# Patient Record
Sex: Female | Born: 1985 | Race: Black or African American | Hispanic: No | Marital: Married | Smoking: Never smoker
Health system: Southern US, Community
[De-identification: ages and names within clinical notes are randomized; demographics above are authoritative.]

## PROBLEM LIST (undated history)

## (undated) DIAGNOSIS — Z87442 Personal history of urinary calculi: Secondary | ICD-10-CM

## (undated) DIAGNOSIS — Z9889 Other specified postprocedural states: Secondary | ICD-10-CM

## (undated) DIAGNOSIS — R112 Nausea with vomiting, unspecified: Secondary | ICD-10-CM

## (undated) DIAGNOSIS — K219 Gastro-esophageal reflux disease without esophagitis: Secondary | ICD-10-CM

## (undated) DIAGNOSIS — Z8489 Family history of other specified conditions: Secondary | ICD-10-CM

## (undated) HISTORY — DX: Gastro-esophageal reflux disease without esophagitis: K21.9

---

## 2003-09-14 ENCOUNTER — Emergency Department (HOSPITAL_COMMUNITY): Admission: EM | Admit: 2003-09-14 | Discharge: 2003-09-14 | Payer: Self-pay | Admitting: Emergency Medicine

## 2005-11-09 ENCOUNTER — Encounter (INDEPENDENT_AMBULATORY_CARE_PROVIDER_SITE_OTHER): Payer: Self-pay | Admitting: Specialist

## 2005-11-09 ENCOUNTER — Ambulatory Visit (HOSPITAL_COMMUNITY): Admission: RE | Admit: 2005-11-09 | Discharge: 2005-11-09 | Payer: Self-pay | Admitting: Obstetrics and Gynecology

## 2007-11-21 ENCOUNTER — Ambulatory Visit (HOSPITAL_COMMUNITY): Admission: RE | Admit: 2007-11-21 | Discharge: 2007-11-21 | Payer: Self-pay | Admitting: Obstetrics and Gynecology

## 2007-11-29 ENCOUNTER — Emergency Department (HOSPITAL_COMMUNITY): Admission: EM | Admit: 2007-11-29 | Discharge: 2007-11-29 | Payer: Self-pay | Admitting: Emergency Medicine

## 2007-11-30 ENCOUNTER — Ambulatory Visit (HOSPITAL_COMMUNITY): Admission: RE | Admit: 2007-11-30 | Discharge: 2007-11-30 | Payer: Self-pay | Admitting: Obstetrics and Gynecology

## 2007-11-30 ENCOUNTER — Encounter: Payer: Self-pay | Admitting: Obstetrics and Gynecology

## 2008-08-05 ENCOUNTER — Emergency Department (HOSPITAL_COMMUNITY): Admission: EM | Admit: 2008-08-05 | Discharge: 2008-08-05 | Payer: Self-pay | Admitting: Emergency Medicine

## 2010-10-19 LAB — COMPREHENSIVE METABOLIC PANEL
ALT: 17 U/L (ref 0–35)
AST: 18 U/L (ref 0–37)
CO2: 29 mEq/L (ref 19–32)
Calcium: 9.8 mg/dL (ref 8.4–10.5)
GFR calc Af Amer: >60 mL/min (ref >60)
GFR calc non Af Amer: >60 mL/min (ref >60)
Sodium: 135 mEq/L (ref 135–145)
Total Protein: 7.8 g/dL (ref 6.0–8.3)

## 2010-10-19 LAB — URINALYSIS, ROUTINE W REFLEX MICROSCOPIC
Bilirubin Urine: NEGATIVE
Nitrite: NEGATIVE
Specific Gravity, Urine: 1.015 (ref 1.005–1.030)
Urobilinogen, UA: 1 mg/dL (ref 0.0–1.0)

## 2010-10-19 LAB — DIFFERENTIAL
Eosinophils Absolute: 0.1 10*3/uL (ref 0.0–0.7)
Eosinophils Relative: 1 % (ref 0–5)
Lymphs Abs: 3.3 10*3/uL (ref 0.7–4.0)
Monocytes Relative: 6 % (ref 3–12)

## 2010-10-19 LAB — PREGNANCY, URINE: Preg Test, Ur: NEGATIVE

## 2010-10-19 LAB — WET PREP, GENITAL
Trich, Wet Prep: NONE SEEN
Yeast Wet Prep HPF POC: NONE SEEN

## 2010-10-19 LAB — CBC
MCHC: 34.1 g/dL (ref 30.0–36.0)
RBC: 4.6 MIL/uL (ref 3.87–5.11)

## 2010-10-22 ENCOUNTER — Inpatient Hospital Stay (HOSPITAL_COMMUNITY)
Admission: AD | Admit: 2010-10-22 | Discharge: 2010-10-22 | Disposition: A | Discharge status: Home / Self Care | Payer: Self-pay | Source: Ambulatory Visit | Attending: Obstetrics and Gynecology | Admitting: Obstetrics and Gynecology

## 2010-10-22 ENCOUNTER — Encounter (HOSPITAL_COMMUNITY): Payer: Self-pay | Admitting: Radiology

## 2010-10-22 ENCOUNTER — Inpatient Hospital Stay (HOSPITAL_COMMUNITY): Payer: Self-pay

## 2010-10-22 DIAGNOSIS — R52 Pain, unspecified: Secondary | ICD-10-CM

## 2010-10-22 DIAGNOSIS — R58 Hemorrhage, not elsewhere classified: Secondary | ICD-10-CM

## 2010-10-22 DIAGNOSIS — O200 Threatened abortion: Secondary | ICD-10-CM | POA: Insufficient documentation

## 2010-10-22 LAB — URINALYSIS, ROUTINE W REFLEX MICROSCOPIC
Bilirubin Urine: NEGATIVE
Ketones, ur: 15 mg/dL — AB
Nitrite: NEGATIVE
Urobilinogen, UA: 1 mg/dL (ref 0.0–1.0)

## 2010-10-22 LAB — CBC
HCT: 40.5 % (ref 36.0–46.0)
MCH: 28.6 pg (ref 26.0–34.0)
MCV: 86.5 fL (ref 78.0–100.0)
Platelets: 366 10*3/uL (ref 150–400)
RBC: 4.68 MIL/uL (ref 3.87–5.11)

## 2010-10-22 LAB — DIFFERENTIAL
Eosinophils Absolute: 0.1 10*3/uL (ref 0.0–0.7)
Eosinophils Relative: 1 % (ref 0–5)
Lymphs Abs: 2.1 10*3/uL (ref 0.7–4.0)
Monocytes Relative: 6 % (ref 3–12)

## 2010-10-22 LAB — WET PREP, GENITAL
Trich, Wet Prep: NONE SEEN
WBC, Wet Prep HPF POC: NONE SEEN

## 2010-10-22 LAB — POCT PREGNANCY, URINE: Preg Test, Ur: POSITIVE

## 2010-10-23 LAB — GC/CHLAMYDIA PROBE AMP, GENITAL
Chlamydia, DNA Probe: NEGATIVE
GC Probe Amp, Genital: NEGATIVE

## 2010-11-08 ENCOUNTER — Other Ambulatory Visit: Payer: Self-pay | Admitting: Obstetrics and Gynecology

## 2010-11-08 ENCOUNTER — Encounter (INDEPENDENT_AMBULATORY_CARE_PROVIDER_SITE_OTHER): Payer: Self-pay | Admitting: Obstetrics and Gynecology

## 2010-11-08 DIAGNOSIS — O039 Complete or unspecified spontaneous abortion without complication: Secondary | ICD-10-CM

## 2010-11-08 DIAGNOSIS — Z01419 Encounter for gynecological examination (general) (routine) without abnormal findings: Secondary | ICD-10-CM

## 2010-11-09 NOTE — Group Therapy Note (Signed)
Darlene Vazquez, BOUR NO.:  [id number]  MEDICAL RECORD NO.:  [id number]           PATIENT TYPE:  A  LOCATION:  [facility]                   FACILITY:  [facility]  PHYSICIAN:  Argentina Donovan, MD        DATE OF BIRTH:  06-22-1986  DATE OF SERVICE:  11/08/2010                                 CLINIC NOTE  CHIEF COMPLAINT:  Followup for ED visit for vaginal bleeding and SAB.  HISTORY OF PRESENT ILLNESS:  The patient is a 25 year old African American now G3, P 0-0-3-0, here for ED followup on October 22, 2010.  The patient reports that she had a known pregnancy, developed vaginal bleeding on October 21, 2010, and went to the ED on October 22, 2010, for evaluation.  She reports knowing that she was less than [redacted] weeks pregnant at that time.  At the ED, she was noted to have a positive urine pregnancy test and then a serum hCG of 4883.  Otherwise, her CBC was within normal limits, and she has O positive blood type.  She also had transvaginal ultrasound at that time, shows no gestational sac, and found it suspicious for a SAB given the history of bleeding.  She also has noted to have two small fibroids seen.  Therefore, she was instructed to follow up in this clinic today.  She reports that since being seen on October 22, 2010, she has been doing better.  She has continued to have vaginal bleeding until last 3-4 days, not heavy, not passing a severe amount of clots.  Otherwise, she is doing well.  No other symptoms.  She did take the Cytotec that was prescribed in the ED. She is interested in becoming pregnant in the future and is on a contraception now.  She is due for Pap smear.  PAST MEDICAL HISTORY:  She has hypertension, she is on medications, atenolol and chlorthalidone combination.  SURGICAL HISTORY:  None other than a history of tubal pregnancy in which she had her left fallopian tube removed.  FAMILY HISTORY:  Hypertension and diabetes.  No history of breast cancer, colon  cancer, or ovarian cancer.  GYNECOLOGICAL HISTORY:  No history of abnormal Pap smear or history of STDs.  ALLERGIES:  None.  PHYSICAL EXAMINATION:  VITAL SIGNS:  Temperature 97.1, pulse 94, blood pressure is 146/99, weight 161 pounds, height 5 feet 1 inch. GENERAL:  Awake, alert, and oriented x3.  A pleasant-appearing female. HEAD:  Atraumatic and normocephalic. HEART:  Regular rate and rhythm.  No murmurs, rubs, or gallops. LUNGS:  Clear to auscultation bilaterally.  No wheezes, rhonchi, or rales. ABDOMEN:  Soft, nontender, nondistended.  Bowel sounds x4. VAGINA:  No external lesions.  No blood.  No discharge noted in vagina. Cervix appeared normal.  Pap smear was obtained.  Bimanual exam, no adnexal tenderness, no uterine abnormalities.  No tenderness.  ASSESSMENT AND PLAN: 1. History of spontaneous abortion.  We will obtain quantitative hCG     today to make sure it is trending downward.  Also check a CBC.  Pap     smear obtained today, also gave preconception and counseling.  Instructed the patient to start on prenatal vitamins. 2. Hypertension.  Her blood pressure was only mildly elevated today,     146/99, recheck was 131/80.  The patient does report that she does     feel better and she has been on a blood pressure medicine for a     long time.  So, I gave her a refill of hydrochlorothiazide 25 mg     p.o. 90-day supply, she can take daily.  She will need to follow up     with her PCP for further refills.  I did discontinue the atenolol     due to it's category B risk.  The patient voiced understanding and     will follow up as needed.          ______________________________ Argentina Donovan, MD    PR/MEDQ  D:  11/08/2010  T:  11/09/2010  Job:  [id number]

## 2010-11-16 NOTE — Op Note (Signed)
NAMEANNAKA, CLEAVER                ACCOUNT NO.:  [id number]   MEDICAL RECORD NO.:  [id number]          PATIENT TYPE:  AMB   LOCATION:  DAY                           FACILITY:  [facility]   PHYSICIAN:  Tilda Burrow, M.D. DATE OF BIRTH:  Feb 19, 1986   DATE OF PROCEDURE:  11/30/2007  DATE OF DISCHARGE:                               OPERATIVE REPORT   PREOPERATIVE DIAGNOSIS:  Missed abortion.   POSTOPERATIVE DIAGNOSIS:  Missed abortion.   PROCEDURES:  Suction, dilation, and curettage.   SURGEON:  Tilda Burrow, MD   ASSISTANT:  None.   ANESTHESIA:  General with LMA.   COMPLICATIONS:  None.   FINDINGS:  Uterus sounding in the anteflexed position to 11 cm  preprocedure.  Products of conception consistent with missed AB removed.  Blood type RH positive.   DETAILS OF PROCEDURE:  The patient was taken to the operating room,  prepped and draped for vaginal procedure.  A single-tooth tenaculum was  used to grasp the prepped cervix, on the anterior lip, and then the  uterus sounded to 11 cm in the anteflexed position.  The cervical  dilation with Hegar dilators was used to dilated the cervix to 29-French  allowing introduction of a curved 9-mm suction curette, which was then  placed on suction.  Circumferential curettage performed obtaining blood,  fluid, and tissue consistent with missed AB.  Smooth sharp curettage in  all quadrants confirmed the uterus was satisfactorily evacuated.  There  was sensational heart shape contour to the intrauterine cavity, not  particularly of extreme, and both cornual areas seemed satisfactorily  evacuated at the end of the procedure.  The patient tolerated the  procedure well and went to recovery room in good condition.   ESTIMATED BLOOD LOSS:  300 mL.      Tilda Burrow, M.D.  Electronically Signed     JVF/MEDQ  D:  11/30/2007  T:  12/01/2007  Job:  [id number]

## 2010-11-16 NOTE — H&P (Signed)
Darlene Vazquez, Darlene Vazquez                ACCOUNT NO.:  [id number]   MEDICAL RECORD NO.:  [id number]          PATIENT TYPE:  AMB   LOCATION:  DAY                           FACILITY:  [facility]   PHYSICIAN:  Tilda Burrow, M.D. DATE OF BIRTH:  [birth year]   DATE OF ADMISSION:  DATE OF DISCHARGE:  LH                              HISTORY & PHYSICAL   ADMISSION DIAGNOSIS:  Missed abortion at [redacted] weeks gestation.   HISTORY OF PRESENT ILLNESS:  This 25 year old female, gravida 2, para 0  AB 1, LMP March ?  date, ultrasound [facility] July 06, 2008, based on 6-week  ultrasound, presents back for a follow-up visit and has absence of fetal  heart motion on visit Nov 16, 2007.  The fetal pole was 5 mm.  It has  been followed for a week, and there has been no increase in size, and  there is no fetal heart motion present.  The patient accepts the  inevitability of pregnancy loss at this point.  The patient understands  the nonviability of the pregnancy and finally accepts it.  She desires  to proceed with suction D&C.  This procedure has been reviewed with the  patient, and we will proceed tomorrow on Nov 22, 2007.   PAST MEDICAL HISTORY:  Benign.   SURGICAL HISTORY:  Left ectopic surgery, 2007, treated by left linear  salpingostomy converted to salpingectomy due to bleeding.   PHYSICAL EXAMINATION:  GENERAL:  Shows an anxious female, alert,  oriented x3.  HEENT:  Pupils equal, round, and reactive.  NECK:  Supple.  CARDIOVASCULAR:  Unremarkable.  ABDOMEN:  Nontender.  PELVIC:  External genitalia normal.  Uterus 6 weeks' size.   LABORATORY DATA:  Includes a low progesterone level of 7.6 when checked  on Nov 14, 2007, quantitative HCG of 62,000.  Hemoglobin 13.6,  hematocrit 41.  Urine drug screen negative.  Maternal blood type is  ___________.   IMPRESSION:  Pregnancy 8 weeks, missed abortion.   PLAN:  Suction dilation and curettage scheduled for Nov 22, 2007.  Will  perform suction D&C and confirm  blood type prior to discharge tomorrow.   Note:  Patient has declined to proceed with D& C , and as of 11/28/07,  patient has not returned to [facility] for followup, though contacted  by phone      Tilda Burrow, M.D.  Electronically Signed     JVF/MEDQ  D:  11/21/2007  T:  11/22/2007  Job:  [id number]

## 2010-11-19 NOTE — H&P (Signed)
Darlene Vazquez, Darlene Vazquez             ACCOUNT NO.:  [id number]   MEDICAL RECORD NO.:  [id number]          PATIENT TYPE:  AMB   LOCATION:  DAY                           FACILITY:  [facility]   PHYSICIAN:  Tilda Burrow, M.D. DATE OF BIRTH:  August 02, 1985   DATE OF ADMISSION:  11/09/2005  DATE OF DISCHARGE:  LH                                HISTORY & PHYSICAL   PREOPERATIVE DIAGNOSIS:  Left ectopic pregnancy.   ADMISSION DIAGNOSIS:  Left ectopic pregnancy.   HISTORY OF PRESENT ILLNESS:  This 25 year old female diagnosed with left  ectopic pregnancy on Tuesday of this week based on quantitative HCG of  30,000, no bleeding and actually no pain but ultrasound identification of  left adnexal gestational sac.  Patient was counseled of her treatment  options and after discussion of consideration of methotrexate versus  surgical intervention, agrees to the recommended laparoscopy.  Plans are for  linear salpingostomy and attempted salvage of tube.   ALLERGIES:  NO KNOWN DRUG ALLERGIES.   MEDICATION:  Prenatal vitamins.   PAST MEDICAL HISTORY:  Benign.   PAST SURGICAL HISTORY:  Negative.   PHYSICAL EXAMINATION:  VITAL SIGNS:  Height 5 feet 2 inches, weight 163,  blood pressure 131/82, pulse 100, respirations 20.  HEENT:  Pupils are equal, round and reactive to light.  Extraocular  movements intact.  CHEST:  Clear.  CARDIOVASCULAR:  Regular rate and rhythm.  ABDOMEN:  Nontender without scars.   Ultrasound confirming anteflexed uterus, tiny endometrial stripe.  Large  left adnexal sac greater than 2 cm without any yolk sac or fetal pole  identifiable.  Ovaries were distinguishable on ultrasound.   ASSESSMENT:  Left ectopic.   PLAN:  Left linear salpingostomy, possible salpingectomy.      Tilda Burrow, M.D.  Electronically Signed     JVF/MEDQ  D:  11/09/2005  T:  11/09/2005  Job:  [id number]

## 2010-11-19 NOTE — Op Note (Signed)
Darlene Vazquez, LEVANDOWSKI             ACCOUNT NO.:  [id number]   MEDICAL RECORD NO.:  [id number]          PATIENT TYPE:  AMB   LOCATION:  DAY                           FACILITY:  [facility]   PHYSICIAN:  Tilda Burrow, M.D. DATE OF BIRTH:  1985-10-18   DATE OF PROCEDURE:  11/09/2005  DATE OF DISCHARGE:                                 OPERATIVE REPORT   PREOPERATIVE DIAGNOSIS:  Left ectopic pregnancy, unruptured.   POSTOPERATIVE DIAGNOSIS:  Left ectopic pregnancy, unruptured.   PROCEDURE:  Left linear salpingostomy converted to left salpingectomy.   SURGEON:  Tilda Burrow, M.D.   ASSISTANTAmie Critchley, CST.   ANESTHESIA:  General.   COMPLICATIONS:  Persistent bleeding from ectopic bed not responding to 18 mL  of Pitressin solution and 8 mL of Marcaine with epinephrine.   DETAILS OF PROCEDURE:  The patient was taken to the operating room, prepped  and draped for lower abdominal surgeries with legs in low lithotomy leg  support, Foley catheter in place.  The uterus was _grasped_ with Hulka  tenaculum inside the cervix.  The infraumbilical vertical 1 cm skin incision  was made as well as a transverse suprapubic incision and a similar one in  the right lower quadrant.  Veress needle was introduced with ease through  the umbilicus, being careful to orient towards the pelvis, pneumoperitoneum  performed after confirmation of its peritoneal location using water droplet  testing.  The linear salpingostomy was performed after infiltration around  the ectopic with Pitressin solution 1 mL in 25 mL solution.  The ectopic  tissue could be easily identified and then adequate dissection was used to  mobilize the specimen to where it could be extracted out of the tubal  opening.  The specimen was removed easily and further irrigation confirmed a  rather complete evacuation.  Unfortunately, the patient bled rather  vigorously from this.  Percutaneous injection into the infundibulopelvic  ligament was then performed again, using Pitressin solution and then once  again the tube was grasped with a laparoscopic Babcock placed under some  compressive effect and held for another 5 minutes.  Once again the placental  bed bled vigorously.  The efforts to salvage _the fallopian tube__ were  continued one further time when Marcaine solution 8 mL was injected around  the ectopic bed and in the infundibulopelvic ligament.  Again, several  minutes of waiting was performed under pressure to try to control to achieve  hemostasis.  Unfortunately, the bleeding persisted and was sufficiently  vigorous.  It was felt that we were going to not be very successful without  using significant cautery.  We used some cautery on the surface edges.  It  would not interfere with intratubal lumen, but the bed itself could not be  cauterized without causing sufficient damage, it was felt futile to try to  salvage the tube further.  We, therefore, used unipolar cautery to transect  across the tubal base where it enters the uterus, and then placed an  Endoloop around the mesosalpinx.  Specimen was then cut off, the stump  confirmed as hemostatic.  Irrigation  of the pelvis again performed,  hemostasis confirmed.  Saline, 200 mL or more, was left in the abdomen, the  laparoscopic equipment  removed after decompression of the carbon dioxide, and then subcuticular #3-  0 Dexon closure of the skin incisions performed.  The patient tolerated the  procedure well with blood type confirmed as O positive in the recovery room.  The patient will be discharged this p.m. with routine postsurgical  instructions.      Tilda Burrow, M.D.  Electronically Signed     JVF/MEDQ  D:  11/09/2005  T:  11/10/2005  Job:  [id number]

## 2011-03-30 LAB — CBC
HCT: 39
Hemoglobin: 13.7
MCHC: 35
MCV: 85.4
RBC: 4.57

## 2011-03-30 LAB — DIFFERENTIAL
Basophils Relative: 2 — ABNORMAL HIGH
Eosinophils Absolute: 0.2
Monocytes Absolute: 0.6
Neutro Abs: 3.8

## 2011-03-30 LAB — URINALYSIS, ROUTINE W REFLEX MICROSCOPIC
Bilirubin Urine: NEGATIVE
Ketones, ur: NEGATIVE
Nitrite: NEGATIVE
Protein, ur: NEGATIVE
Specific Gravity, Urine: 1.01
Urobilinogen, UA: 0.2

## 2011-12-21 ENCOUNTER — Other Ambulatory Visit: Payer: Self-pay | Admitting: Lab

## 2013-11-09 ENCOUNTER — Emergency Department (HOSPITAL_COMMUNITY): Payer: Managed Care, Other (non HMO) | Admitting: Anesthesiology

## 2013-11-09 ENCOUNTER — Emergency Department (HOSPITAL_COMMUNITY): Payer: Managed Care, Other (non HMO)

## 2013-11-09 ENCOUNTER — Encounter (HOSPITAL_COMMUNITY): Payer: Managed Care, Other (non HMO) | Admitting: Anesthesiology

## 2013-11-09 ENCOUNTER — Encounter (HOSPITAL_COMMUNITY)
Admission: EM | Disposition: A | Discharge status: Home / Self Care | Payer: Self-pay | Source: Home / Self Care | Attending: Obstetrics & Gynecology

## 2013-11-09 ENCOUNTER — Inpatient Hospital Stay (HOSPITAL_COMMUNITY)
Admission: EM | Admit: 2013-11-09 | Discharge: 2013-11-11 | DRG: 777 | Disposition: A | Discharge status: Home / Self Care | Payer: Managed Care, Other (non HMO) | Attending: Obstetrics & Gynecology | Admitting: Obstetrics & Gynecology

## 2013-11-09 ENCOUNTER — Encounter (HOSPITAL_COMMUNITY): Payer: Self-pay | Admitting: Emergency Medicine

## 2013-11-09 DIAGNOSIS — R1031 Right lower quadrant pain: Secondary | ICD-10-CM

## 2013-11-09 DIAGNOSIS — O009 Unspecified ectopic pregnancy without intrauterine pregnancy: Secondary | ICD-10-CM | POA: Diagnosis present

## 2013-11-09 DIAGNOSIS — O00109 Unspecified tubal pregnancy without intrauterine pregnancy: Principal | ICD-10-CM | POA: Diagnosis present

## 2013-11-09 DIAGNOSIS — R55 Syncope and collapse: Secondary | ICD-10-CM

## 2013-11-09 DIAGNOSIS — D649 Anemia, unspecified: Secondary | ICD-10-CM | POA: Diagnosis present

## 2013-11-09 DIAGNOSIS — I959 Hypotension, unspecified: Secondary | ICD-10-CM | POA: Diagnosis present

## 2013-11-09 HISTORY — PX: DIAGNOSTIC LAPAROSCOPY WITH REMOVAL OF ECTOPIC PREGNANCY: SHX6449

## 2013-11-09 HISTORY — PX: LAPAROSCOPY: SHX197

## 2013-11-09 HISTORY — PX: UNILATERAL SALPINGECTOMY: SHX6160

## 2013-11-09 LAB — CBC WITH DIFFERENTIAL/PLATELET
Basophils Absolute: 0 10*3/uL (ref 0.0–0.1)
Basophils Relative: 0 % (ref 0–1)
Eosinophils Absolute: 0 10*3/uL (ref 0.0–0.7)
Eosinophils Relative: 0 % (ref 0–5)
HCT: 25.6 % — ABNORMAL LOW (ref 36.0–46.0)
Hemoglobin: 8.6 g/dL — ABNORMAL LOW (ref 12.0–15.0)
Lymphocytes Relative: 15 % (ref 12–46)
Lymphs Abs: 3 10*3/uL (ref 0.7–4.0)
MCH: 29.1 pg (ref 26.0–34.0)
MCHC: 33.6 g/dL (ref 30.0–36.0)
MCV: 86.5 fL (ref 78.0–100.0)
Monocytes Absolute: 1 10*3/uL (ref 0.1–1.0)
Monocytes Relative: 5 % (ref 3–12)
Neutro Abs: 15.8 10*3/uL — ABNORMAL HIGH (ref 1.7–7.7)
Neutrophils Relative %: 80 % — ABNORMAL HIGH (ref 43–77)
Platelets: 371 10*3/uL (ref 150–400)
RBC: 2.96 MIL/uL — ABNORMAL LOW (ref 3.87–5.11)
RDW: 13.7 % (ref 11.5–15.5)
WBC: 19.9 10*3/uL — ABNORMAL HIGH (ref 4.0–10.5)

## 2013-11-09 LAB — BASIC METABOLIC PANEL
BUN: 10 mg/dL (ref 6–23)
CO2: 18 mEq/L — ABNORMAL LOW (ref 19–32)
Calcium: 7.8 mg/dL — ABNORMAL LOW (ref 8.4–10.5)
Chloride: 101 mEq/L (ref 96–112)
Creatinine, Ser: 1.39 mg/dL — ABNORMAL HIGH (ref 0.50–1.10)
GFR calc Af Amer: 58 mL/min — ABNORMAL LOW (ref >90)
GFR calc non Af Amer: 50 mL/min — ABNORMAL LOW (ref >90)
Glucose, Bld: 201 mg/dL — ABNORMAL HIGH (ref 70–99)
Potassium: 3.6 mEq/L — ABNORMAL LOW (ref 3.7–5.3)
Sodium: 138 mEq/L (ref 137–147)

## 2013-11-09 LAB — LACTIC ACID, PLASMA: Lactic Acid, Venous: 7.2 mmol/L — ABNORMAL HIGH (ref 0.5–2.2)

## 2013-11-09 LAB — ABO/RH: ABO/RH(D): O POS

## 2013-11-09 LAB — CBG MONITORING, ED: GLUCOSE-CAPILLARY: 190 mg/dL — AB (ref 70–99)

## 2013-11-09 LAB — PREPARE RBC (CROSSMATCH)

## 2013-11-09 LAB — HCG, QUANTITATIVE, PREGNANCY: hCG, Beta Chain, Quant, S: 32048 m[IU]/mL — ABNORMAL HIGH (ref <5)

## 2013-11-09 SURGERY — LAPAROSCOPY OPERATIVE
Anesthesia: General | Site: Abdomen | Laterality: Right

## 2013-11-09 MED ORDER — ONDANSETRON HCL 4 MG PO TABS
8.0000 mg | ORAL_TABLET | Freq: Four times a day (QID) | ORAL | Status: DC | PRN
  Administered 2013-11-09: 8 mg via ORAL
  Filled 2013-11-09: qty 2

## 2013-11-09 MED ORDER — FENTANYL CITRATE 0.05 MG/ML IJ SOLN
50.0000 ug | Freq: Once | INTRAMUSCULAR | Status: AC
  Administered 2013-11-09: 50 ug via INTRAVENOUS
  Filled 2013-11-09: qty 2

## 2013-11-09 MED ORDER — NEOSTIGMINE METHYLSULFATE 10 MG/10ML IV SOLN
INTRAVENOUS | Status: DC | PRN
  Administered 2013-11-09 (×3): 1 mg via INTRAVENOUS

## 2013-11-09 MED ORDER — LABETALOL HCL 5 MG/ML IV SOLN
INTRAVENOUS | Status: AC
  Filled 2013-11-09: qty 4

## 2013-11-09 MED ORDER — SODIUM CHLORIDE 0.9 % IV SOLN
INTRAVENOUS | Status: DC | PRN
  Administered 2013-11-09: 17:00:00 via INTRAVENOUS

## 2013-11-09 MED ORDER — DOCUSATE SODIUM 100 MG PO CAPS
100.0000 mg | ORAL_CAPSULE | Freq: Two times a day (BID) | ORAL | Status: DC
  Administered 2013-11-09 – 2013-11-11 (×4): 100 mg via ORAL
  Filled 2013-11-09 (×4): qty 1

## 2013-11-09 MED ORDER — SODIUM CHLORIDE 0.9 % IV BOLUS (SEPSIS)
1000.0000 mL | Freq: Once | INTRAVENOUS | Status: AC
  Administered 2013-11-09: 1000 mL via INTRAVENOUS

## 2013-11-09 MED ORDER — LACTATED RINGERS IV SOLN
INTRAVENOUS | Status: DC | PRN
  Administered 2013-11-09 (×2): via INTRAVENOUS

## 2013-11-09 MED ORDER — LIDOCAINE HCL (PF) 1 % IJ SOLN
INTRAMUSCULAR | Status: AC
  Filled 2013-11-09: qty 5

## 2013-11-09 MED ORDER — 0.9 % SODIUM CHLORIDE (POUR BTL) OPTIME
TOPICAL | Status: DC | PRN
  Administered 2013-11-09: 500 mL

## 2013-11-09 MED ORDER — SODIUM CHLORIDE 0.9 % IR SOLN
Status: DC | PRN
  Administered 2013-11-09: 3000 mL

## 2013-11-09 MED ORDER — GLYCOPYRROLATE 0.2 MG/ML IJ SOLN
INTRAMUSCULAR | Status: DC | PRN
  Administered 2013-11-09: 0.4 mg via INTRAVENOUS

## 2013-11-09 MED ORDER — FENTANYL CITRATE 0.05 MG/ML IJ SOLN
INTRAMUSCULAR | Status: AC
  Filled 2013-11-09: qty 5

## 2013-11-09 MED ORDER — MIDAZOLAM HCL 2 MG/2ML IJ SOLN
INTRAMUSCULAR | Status: AC
  Filled 2013-11-09: qty 2

## 2013-11-09 MED ORDER — ONDANSETRON 8 MG/NS 50 ML IVPB
8.0000 mg | Freq: Four times a day (QID) | INTRAVENOUS | Status: DC | PRN
  Filled 2013-11-09: qty 8

## 2013-11-09 MED ORDER — FENTANYL CITRATE 0.05 MG/ML IJ SOLN
INTRAMUSCULAR | Status: DC | PRN
  Administered 2013-11-09 (×7): 50 ug via INTRAVENOUS
  Administered 2013-11-09: 100 ug via INTRAVENOUS

## 2013-11-09 MED ORDER — SODIUM CHLORIDE 0.9 % IV BOLUS (SEPSIS)
2000.0000 mL | Freq: Once | INTRAVENOUS | Status: AC
  Administered 2013-11-09: 2000 mL via INTRAVENOUS

## 2013-11-09 MED ORDER — SUCCINYLCHOLINE CHLORIDE 20 MG/ML IJ SOLN
INTRAMUSCULAR | Status: DC | PRN
  Administered 2013-11-09: 150 mg via INTRAVENOUS

## 2013-11-09 MED ORDER — SUCCINYLCHOLINE CHLORIDE 20 MG/ML IJ SOLN
INTRAMUSCULAR | Status: AC
  Filled 2013-11-09: qty 1

## 2013-11-09 MED ORDER — PROPOFOL 10 MG/ML IV BOLUS
INTRAVENOUS | Status: DC | PRN
  Administered 2013-11-09: 120 mg via INTRAVENOUS

## 2013-11-09 MED ORDER — VANCOMYCIN HCL IN DEXTROSE 1-5 GM/200ML-% IV SOLN: 1000.0000 mg | Freq: Once | INTRAVENOUS | Status: DC

## 2013-11-09 MED ORDER — ONDANSETRON HCL 4 MG/2ML IJ SOLN
4.0000 mg | Freq: Once | INTRAMUSCULAR | Status: DC
  Filled 2013-11-09: qty 2

## 2013-11-09 MED ORDER — ALUM & MAG HYDROXIDE-SIMETH 200-200-20 MG/5ML PO SUSP: 30.0000 mL | ORAL | Status: DC | PRN

## 2013-11-09 MED ORDER — OXYCODONE-ACETAMINOPHEN 5-325 MG PO TABS
1.0000 | ORAL_TABLET | ORAL | Status: DC | PRN
  Administered 2013-11-10 – 2013-11-11 (×5): 1 via ORAL
  Filled 2013-11-09: qty 1
  Filled 2013-11-09: qty 2
  Filled 2013-11-09 (×3): qty 1

## 2013-11-09 MED ORDER — FENTANYL CITRATE 0.05 MG/ML IJ SOLN
INTRAMUSCULAR | Status: AC
  Filled 2013-11-09: qty 2

## 2013-11-09 MED ORDER — ONDANSETRON HCL 4 MG/2ML IJ SOLN
INTRAMUSCULAR | Status: DC | PRN
  Administered 2013-11-09: 4 mg via INTRAVENOUS

## 2013-11-09 MED ORDER — GLYCOPYRROLATE 0.2 MG/ML IJ SOLN
INTRAMUSCULAR | Status: AC
  Filled 2013-11-09: qty 2

## 2013-11-09 MED ORDER — KCL IN DEXTROSE-NACL 20-5-0.45 MEQ/L-%-% IV SOLN
INTRAVENOUS | Status: DC
  Administered 2013-11-09 – 2013-11-11 (×3): via INTRAVENOUS

## 2013-11-09 MED ORDER — ONDANSETRON HCL 4 MG/2ML IJ SOLN
4.0000 mg | Freq: Once | INTRAMUSCULAR | Status: AC
  Administered 2013-11-09: 4 mg via INTRAVENOUS

## 2013-11-09 MED ORDER — PROPOFOL 10 MG/ML IV EMUL
INTRAVENOUS | Status: AC
  Filled 2013-11-09: qty 20

## 2013-11-09 MED ORDER — ROCURONIUM BROMIDE 50 MG/5ML IV SOLN
INTRAVENOUS | Status: AC
  Filled 2013-11-09: qty 1

## 2013-11-09 MED ORDER — HYDROMORPHONE HCL PF 1 MG/ML IJ SOLN
1.0000 mg | INTRAMUSCULAR | Status: DC | PRN
  Administered 2013-11-09: 2 mg via INTRAVENOUS
  Filled 2013-11-09: qty 2

## 2013-11-09 MED ORDER — ONDANSETRON HCL 4 MG/2ML IJ SOLN
INTRAMUSCULAR | Status: AC
  Filled 2013-11-09: qty 2

## 2013-11-09 MED ORDER — LABETALOL HCL 5 MG/ML IV SOLN
INTRAVENOUS | Status: DC | PRN
Start: 2013-11-09 — End: 2013-11-09
  Administered 2013-11-09: 5 mg via INTRAVENOUS

## 2013-11-09 MED ORDER — PIPERACILLIN-TAZOBACTAM 3.375 G IVPB
3.3750 g | Freq: Once | INTRAVENOUS | Status: AC
  Administered 2013-11-09: 3.375 g via INTRAVENOUS
  Filled 2013-11-09: qty 50

## 2013-11-09 MED ORDER — ROCURONIUM BROMIDE 100 MG/10ML IV SOLN
INTRAVENOUS | Status: DC | PRN
  Administered 2013-11-09: 10 mg via INTRAVENOUS
  Administered 2013-11-09: 25 mg via INTRAVENOUS
  Administered 2013-11-09: 15 mg via INTRAVENOUS

## 2013-11-09 MED ORDER — MIDAZOLAM HCL 5 MG/5ML IJ SOLN
INTRAMUSCULAR | Status: DC | PRN
  Administered 2013-11-09: 2 mg via INTRAVENOUS
  Administered 2013-11-09 (×2): 1 mg via INTRAVENOUS

## 2013-11-09 SURGICAL SUPPLY — 44 items
APPLIER CLIP LAPSCP 10X32 DD (CLIP) ×2 IMPLANT
BAG HAMPER (MISCELLANEOUS) ×2 IMPLANT
BAG SPEC RTRVL LRG 6X4 10 (ENDOMECHANICALS) ×2
BLADE SURG SZ11 CARB STEEL (BLADE) ×2 IMPLANT
CLOTH BEACON ORANGE TIMEOUT ST (SAFETY) ×2 IMPLANT
DRAPE PROXIMA HALF (DRAPES) ×2 IMPLANT
ELECT REM PT RETURN 9FT ADLT (ELECTROSURGICAL) ×4
ELECTRODE REM PT RTRN 9FT ADLT (ELECTROSURGICAL) IMPLANT
GLOVE BIOGEL PI IND STRL 8 (GLOVE) IMPLANT
GLOVE BIOGEL PI INDICATOR 8 (GLOVE) ×2
GLOVE ECLIPSE 8.0 STRL XLNG CF (GLOVE) ×2 IMPLANT
GLOVE EXAM NITRILE PF MED BLUE (GLOVE) ×2 IMPLANT
GLOVE INDICATOR 7.0 STRL GRN (GLOVE) ×4 IMPLANT
GLOVE SS BIOGEL STRL SZ 6.5 (GLOVE) IMPLANT
GLOVE SUPERSENSE BIOGEL SZ 6.5 (GLOVE) ×2
GOWN STRL REUS W/TWL LRG LVL3 (GOWN DISPOSABLE) ×2 IMPLANT
GOWN STRL REUS W/TWL XL LVL3 (GOWN DISPOSABLE) ×2 IMPLANT
INST SET LAPROSCOPIC GYN AP (KITS) ×2 IMPLANT
IV NS 1000ML (IV SOLUTION) ×4
IV NS 1000ML BAXH (IV SOLUTION) IMPLANT
IV NS IRRIG 3000ML ARTHROMATIC (IV SOLUTION) ×2 IMPLANT
KIT ROOM TURNOVER APOR (KITS) ×2 IMPLANT
MANIFOLD NEPTUNE II (INSTRUMENTS) ×2 IMPLANT
NS IRRIG 1000ML POUR BTL (IV SOLUTION) ×2 IMPLANT
PACK PERI GYN (CUSTOM PROCEDURE TRAY) ×2 IMPLANT
PAD ARMBOARD 7.5X6 YLW CONV (MISCELLANEOUS) ×2 IMPLANT
POUCH SPECIMEN RETRIEVAL 10MM (ENDOMECHANICALS) ×2 IMPLANT
SCALPEL HARMONIC ACE (MISCELLANEOUS) ×4 IMPLANT
SET BASIN LINEN APH (SET/KITS/TRAYS/PACK) ×2 IMPLANT
SET TUBE IRRIG SUCTION NO TIP (IRRIGATION / IRRIGATOR) ×2 IMPLANT
SLEEVE ENDOPATH XCEL 5M (ENDOMECHANICALS) ×2 IMPLANT
SOLUTION ANTI FOG 6CC (MISCELLANEOUS) ×2 IMPLANT
SPONGE GAUZE 2X2 8PLY STER LF (GAUZE/BANDAGES/DRESSINGS) ×4
SPONGE GAUZE 2X2 8PLY STRL LF (GAUZE/BANDAGES/DRESSINGS) ×4 IMPLANT
STAPLER VISISTAT 35W (STAPLE) ×2 IMPLANT
SUT VICRYL 0 UR6 27IN ABS (SUTURE) ×2 IMPLANT
SYRINGE 10CC LL (SYRINGE) ×2 IMPLANT
TAPE CLOTH SURG 4X10 WHT LF (GAUZE/BANDAGES/DRESSINGS) ×2 IMPLANT
TRAY FOLEY CATH 16FR SILVER (SET/KITS/TRAYS/PACK) ×2 IMPLANT
TROCAR ENDO BLADELESS 11MM (ENDOMECHANICALS) ×2 IMPLANT
TROCAR XCEL NON-BLD 5MMX100MML (ENDOMECHANICALS) ×2 IMPLANT
TROCAR XCEL UNIV SLVE 11M 100M (ENDOMECHANICALS) ×2 IMPLANT
TUBING INSUF HEATED (TUBING) ×2 IMPLANT
WARMER LAPAROSCOPE (MISCELLANEOUS) ×2 IMPLANT

## 2013-11-09 NOTE — Transfer of Care (Signed)
Immediate Anesthesia Transfer of Care Note  Patient: Darlene Vazquez  Procedure(s) Performed: Procedure(s): LAPAROSCOPY OPERATIVE (N/A) UNILATERAL SALPINGECTOMY (Right) DIAGNOSTIC LAPAROSCOPY WITH REMOVAL OF ECTOPIC PREGNANCY (Right)  Patient Location: PACU  Anesthesia Type:General  Level of Consciousness: awake and patient cooperative  Airway & Oxygen Therapy: Patient Spontanous Breathing and Patient connected to face mask oxygen  Post-op Assessment: Report given to PACU RN, Post -op Vital signs reviewed and stable and Patient moving all extremities  Post vital signs: Reviewed and stable  Complications: No apparent anesthesia complications

## 2013-11-09 NOTE — ED Notes (Signed)
Blood Consent signed prior to blood product administration.

## 2013-11-09 NOTE — Anesthesia Preprocedure Evaluation (Addendum)
Anesthesia Evaluation  Patient identified by MRN, date of birth, ID band Patient awake    Reviewed: Allergy & Precautions, H&P , NPO status , Patient's Chart, lab work & pertinent test results  Airway Mallampati: I TM Distance: <3 FB Neck ROM: Full    Dental  (+) Teeth Intact   Pulmonary neg pulmonary ROS,  breath sounds clear to auscultation        Cardiovascular Exercise Tolerance: Good hypertension, Rhythm:Regular Rate:Tachycardia     Neuro/Psych    GI/Hepatic negative GI ROS, Neg liver ROS,   Endo/Other    Renal/GU negative Renal ROS     Musculoskeletal   Abdominal (+)  Abdomen: rigid. Bowel sounds: absent.  Peds  Hematology   Anesthesia Other Findings   Reproductive/Obstetrics (+) Pregnancy                          Anesthesia Physical Anesthesia Plan  ASA: I and emergent  Anesthesia Plan: General   Post-op Pain Management:    Induction: Intravenous, Cricoid pressure planned and Rapid sequence  Airway Management Planned: Oral ETT  Additional Equipment:   Intra-op Plan:   Post-operative Plan: Extubation in OR  Informed Consent:   Plan Discussed with: Anesthesiologist  Anesthesia Plan Comments:         Anesthesia Quick Evaluation

## 2013-11-09 NOTE — Anesthesia Procedure Notes (Signed)
Procedure Name: Intubation Date/Time: 11/09/2013 5:40 PM Performed by: Despina HiddenIDACAVAGE, Derrious Bologna J Pre-anesthesia Checklist: Patient being monitored, Suction available, Emergency Drugs available and Patient identified Patient Re-evaluated:Patient Re-evaluated prior to inductionOxygen Delivery Method: Circle system utilized Preoxygenation: Pre-oxygenation with 100% oxygen Intubation Type: IV induction, Rapid sequence and Cricoid Pressure applied Ventilation: Mask ventilation without difficulty Laryngoscope Size: Mac and 3 Grade View: Grade I Tube type: Oral Tube size: 7.0 mm Number of attempts: 1 Airway Equipment and Method: Stylet Placement Confirmation: ETT inserted through vocal cords under direct vision,  positive ETCO2 and breath sounds checked- equal and bilateral Secured at: 22 cm Tube secured with: Tape Dental Injury: Teeth and Oropharynx as per pre-operative assessment

## 2013-11-09 NOTE — ED Notes (Addendum)
Consent obtained and signed. Per Dr. Despina HiddenEure, lab aware to have 2 units of blood on standby.

## 2013-11-09 NOTE — H&P (Signed)
Preoperative History and Physical  Darlene Vazquez is a 28 y.o. G1P0 with No LMP recorded. Patient is pregnant. admitted for a laparoscopic removal of right ectopic pregnancy.  Pt presneted complaining of emesis and a syncopal episode, tachycardic.  Quantitative HCG is 32000 and sonogram reveals right ectopic pregnancy. Will proceed with surgical mangement  PMH:   History reviewed. No pertinent past medical history.  PSH:    History reviewed. No pertinent past surgical history.  POb/GynH:      OB History   Grav Para Term Preterm Abortions TAB SAB Ect Mult Living   1               SH:   History  Substance Use Topics  . Smoking status: Never Smoker   . Smokeless tobacco: Not on file  . Alcohol Use: No    FH:   No family history on file.   Allergies: No Known Allergies  Medications:      Current facility-administered medications:piperacillin-tazobactam (ZOSYN) IVPB 3.375 g, 3.375 g, Intravenous, Once, Raeford RazorStephen Kohut, MD, Last Rate: 12.5 mL/hr at 11/09/13 1559, 3.375 g at 11/09/13 1559 Current outpatient prescriptions:Prenatal Vit-Fe Fumarate-FA (MULTIVITAMIN-PRENATAL) 27-0.8 MG TABS tablet, Take 1 tablet by mouth daily., Disp: , Rfl:   Review of Systems:   Review of Systems  Constitutional: Negative for fever, chills, weight loss, malaise/fatigue and diaphoresis.  HENT: Negative for hearing loss, ear pain, nosebleeds, congestion, sore throat, neck pain, tinnitus and ear discharge.   Eyes: Negative for blurred vision, double vision, photophobia, pain, discharge and redness.  Respiratory: Negative for cough, hemoptysis, sputum production, shortness of breath, wheezing and stridor.   Cardiovascular: Negative for chest pain, palpitations, orthopnea, claudication, leg swelling and PND.  Gastrointestinal: Positive for abdominal pain. Negative for heartburn, nausea, vomiting, diarrhea, constipation, blood in stool and melena.  Genitourinary: Negative for dysuria, urgency, frequency,  hematuria and flank pain.  Musculoskeletal: Negative for myalgias, back pain, joint pain and falls.  Skin: Negative for itching and rash.  Neurological: Negative for dizziness, tingling, tremors, sensory change, speech change, focal weakness, seizures, loss of consciousness, weakness and headaches.  Endo/Heme/Allergies: Negative for environmental allergies and polydipsia. Does not bruise/bleed easily.  Psychiatric/Behavioral: Negative for depression, suicidal ideas, hallucinations, memory loss and substance abuse. The patient is not nervous/anxious and does not have insomnia.      PHYSICAL EXAM:  Blood pressure 90/58, pulse 105, temperature 98.2 F (36.8 C), temperature source Oral, resp. rate 21, height 5\' 4"  (1.626 m), weight 174 lb (78.926 kg), SpO2 100.00%.    Vitals reviewed. Constitutional: She is oriented to person, place, and time. She appears well-developed and well-nourished.  HENT:  Head: Normocephalic and atraumatic.  Right Ear: External ear normal.  Left Ear: External ear normal.  Nose: Nose normal.  Mouth/Throat: Oropharynx is clear and moist.  Eyes: Conjunctivae and EOM are normal. Pupils are equal, round, and reactive to light. Right eye exhibits no discharge. Left eye exhibits no discharge. No scleral icterus.  Neck: Normal range of motion. Neck supple. No tracheal deviation present. No thyromegaly present.  Cardiovascular: Normal rate, regular rhythm, normal heart sounds and intact distal pulses.  Exam reveals no gallop and no friction rub.   No murmur heard. Respiratory: Effort normal and breath sounds normal. No respiratory distress. She has no wheezes. She has no rales. She exhibits no tenderness.  GI: Soft. Bowel sounds are normal. She exhibits no distension and no mass. There is tenderness. There is no rebound and no guarding.  Genitourinary:  pelvic per sonogram      Vulva is normal without lesions Vagina is pink moist without discharge Cervix normal in  appearance  Uterus is normal Adnexa is significant for ruptured ectopic Musculoskeletal: Normal range of motion. She exhibits no edema and no tenderness.  Neurological: She is alert and oriented to person, place, and time. She has normal reflexes. She displays normal reflexes. No cranial nerve deficit. She exhibits normal muscle tone. Coordination normal.  Skin: Skin is warm and dry. No rash noted. No erythema. No pallor.  Psychiatric: She has a normal mood and affect. Her behavior is normal. Judgment and thought content normal.    Labs: Results for orders placed during the hospital encounter of 11/09/13 (from the past 336 hour(s))  CBG MONITORING, ED   Collection Time    11/09/13  1:36 PM      Result Value Ref Range   Glucose-Capillary 190 (*) 70 - 99 mg/dL  HCG, QUANTITATIVE, PREGNANCY   Collection Time    11/09/13  2:24 PM      Result Value Ref Range   hCG, Beta Chain, Sharene Butters, [place] [address] (*) <5 mIU/mL  BASIC METABOLIC PANEL   Collection Time    11/09/13  2:24 PM      Result Value Ref Range   Sodium 138  137 - 147 mEq/L   Potassium 3.6 (*) 3.7 - 5.3 mEq/L   Chloride 101  96 - 112 mEq/L   CO2 18 (*) 19 - 32 mEq/L   Glucose, Bld 201 (*) 70 - 99 mg/dL   BUN 10  6 - 23 mg/dL   Creatinine, Ser 8.41 (*) 0.50 - 1.10 mg/dL   Calcium 7.8 (*) 8.4 - 10.5 mg/dL   GFR calc non Af Amer 50 (*) >90 mL/min   GFR calc Af Amer 58 (*) >90 mL/min  TYPE AND SCREEN   Collection Time    11/09/13  2:24 PM      Result Value Ref Range   ABO/RH(D) O POS     Antibody Screen NEG     Sample Expiration 11/12/2013    LACTIC ACID, PLASMA   Collection Time    11/09/13  3:14 PM      Result Value Ref Range   Lactic Acid, Venous 7.2 (*) 0.5 - 2.2 mmol/L  CULTURE, BLOOD (ROUTINE X 2)   Collection Time    11/09/13  3:15 PM      Result Value Ref Range   Specimen Description BLOOD RIGHT ARM     Special Requests       Value: BOTTLES DRAWN AEROBIC AND ANAEROBIC 6CC EACH BOTTLE   Culture PENDING     Report  Status PENDING    CBC WITH DIFFERENTIAL   Collection Time    11/09/13  4:08 PM      Result Value Ref Range   WBC 19.9 (*) 4.0 - 10.5 K/uL   RBC 2.96 (*) 3.87 - 5.11 MIL/uL   Hemoglobin 8.6 (*) 12.0 - 15.0 g/dL   HCT 32.4 (*) 40.1 - 02.7 %   MCV 86.5  78.0 - 100.0 fL   MCH 29.1  26.0 - 34.0 pg   MCHC 33.6  30.0 - 36.0 g/dL   RDW 25.3  66.4 - 40.3 %   Platelets 371  150 - 400 K/uL   Neutrophils Relative % 80 (*) 43 - 77 %   Neutro Abs 15.8 (*) 1.7 - 7.7 K/uL   Lymphocytes Relative 15  12 - 46 %  Lymphs Abs 3.0  0.7 - 4.0 K/uL   Monocytes Relative 5  3 - 12 %   Monocytes Absolute 1.0  0.1 - 1.0 K/uL   Eosinophils Relative 0  0 - 5 %   Eosinophils Absolute 0.0  0.0 - 0.7 K/uL   Basophils Relative 0  0 - 1 %   Basophils Absolute 0.0  0.0 - 0.1 K/uL   EKG: Orders placed during the hospital encounter of 11/09/13  . EKG 12-LEAD  . EKG 12-LEAD  . EKG 12-LEAD  . EKG 12-LEAD  . ED EKG  . ED EKG    Imaging Studies: No results found.    Assessment: Ruptured right ectopic pregnancy Plan: Laparoscopic removal of right ectopic, probable partial salpingectomy   Darlene Vazquez 11/09/2013 4:31 PM

## 2013-11-09 NOTE — Anesthesia Postprocedure Evaluation (Signed)
  Anesthesia Post-op Note  Patient: Darlene Vazquez  Procedure(s) Performed: Procedure(s): LAPAROSCOPY OPERATIVE (N/A) UNILATERAL SALPINGECTOMY (Right) DIAGNOSTIC LAPAROSCOPY WITH REMOVAL OF ECTOPIC PREGNANCY (Right)  Patient Location: PACU  Anesthesia Type:General  Level of Consciousness: awake, alert , oriented and patient cooperative  Airway and Oxygen Therapy: Patient Spontanous Breathing  Post-op Pain: 3 /10, mild  Post-op Assessment: Post-op Vital signs reviewed, Patient's Cardiovascular Status Stable, Respiratory Function Stable, Patent Airway and No signs of Nausea or vomiting  Post-op Vital Signs: Reviewed and stable  Last Vitals:  Filed Vitals:   11/09/13 1715  BP: 120/60  Pulse: 116  Temp:   Resp: 14    Complications: No apparent anesthesia complications

## 2013-11-09 NOTE — ED Notes (Signed)
Pt taken to OR prior to first 15 minutes assessment. CRNA at bedside and gave pt versed during blood administration. Pt drowsy but alert at time of transport to OR. nad noted.

## 2013-11-09 NOTE — ED Notes (Signed)
Pt states she had a positive pregnancy test a week ago. States she passed out today

## 2013-11-09 NOTE — ED Provider Notes (Signed)
CSN: [id number]     Arrival date & time 11/09/13  1332 History  This chart was scribed for Raeford Razor, MD by Luisa Dago, ED Scribe. This patient was seen in room APA08/AP[facility]08 and the patient's care was started at 1:50 PM.    Chief Complaint  Patient presents with  . Loss of Consciousness    The history is provided by the patient. No language interpreter was used.   HPI Comments: Darlene Vazquez is a 28 y.o. female who presents to the Emergency Department complaining of an episode of syncope that occurred today. She states that she was going to turn off the TV when she lost consciousness. Pt states that one week ago she had a positive home pregnancy test. She states that she's also had several episodes of emesis ("a few") before losing consciusness.  Was in usual state of health when went to bed. She states that she has a history of tubal pregnancies. Reports that her left tube have been taken out. Pt states that her last LNMP was in march. She states that she has not been seen by her OB/GYN. Pt does not recall the symptoms she experienced during her tubal pregnancy. She is also complaining of associated chills. Denies any chest pain or SOB. No urinary complaints. No vaginal bleeding or discharge. No sick contacts. No diarrhea.   History reviewed. No pertinent past medical history. History reviewed. No pertinent past surgical history. No family history on file. History  Substance Use Topics  . Smoking status: Never Smoker   . Smokeless tobacco: Not on file  . Alcohol Use: No   OB History   Grav Para Term Preterm Abortions TAB SAB Ect Mult Living   1              Review of Systems  Constitutional: Positive for chills.  Neurological: Positive for syncope.  All other systems reviewed and are negative.     Allergies  Review of patient's allergies indicates no known allergies.  Home Medications   Prior to Admission medications   Not on File   BP 74/48  Pulse 110  Temp(Src)  97.8 F (36.6 C) (Oral)  Resp 22  Ht 5\' 4"  (1.626 m)  Wt 174 lb (78.926 kg)  BMI 29.85 kg/m2  SpO2 100%  Physical Exam  Nursing note and vitals reviewed. Constitutional: She appears well-developed and well-nourished. No distress.  HENT:  Head: Normocephalic and atraumatic.  Eyes: Conjunctivae are normal. Right eye exhibits no discharge. Left eye exhibits no discharge.  Neck: Neck supple.  Cardiovascular: Regular rhythm and normal heart sounds.  Tachycardia present.  Exam reveals no gallop and no friction rub.   No murmur heard. Pulmonary/Chest: Effort normal and breath sounds normal. No respiratory distress.  Abdominal: Soft. She exhibits no distension. There is tenderness (lower abdomen). There is no rebound and no guarding.  Musculoskeletal: She exhibits no edema and no tenderness.  Neurological: She is alert.  Skin: Skin is warm and dry.  Psychiatric: She has a normal mood and affect. Her behavior is normal. Thought content normal.    ED Course  Procedures (including critical care time)  CRITICAL CARE Performed by: Raeford Razor  Total critical care time: 50 minutes  Critical care time was exclusive of separately billable procedures and treating other patients. Critical care was necessary to treat or prevent imminent or life-threatening deterioration. Critical care was time spent personally by me on the following activities: development of treatment plan with patient and/or surrogate as  well as nursing, discussions with consultants, evaluation of patient's response to treatment, examination of patient, obtaining history from patient or surrogate, ordering and performing treatments and interventions, ordering and review of laboratory studies, ordering and review of radiographic studies, pulse oximetry and re-evaluation of patient's condition.   DIAGNOSTIC STUDIES: Oxygen Saturation is 100% on RA, normal by my interpretation.    COORDINATION OF CARE: 1:58 PM- Will order an  ultrasound. Pt advised of plan for treatment and pt agrees.  Labs Review Labs Reviewed  BASIC METABOLIC PANEL - Abnormal; Notable for the following:    Potassium 3.6 (*)    CO2 18 (*)    Glucose, Bld 201 (*)    Creatinine, Ser 1.39 (*)    Calcium 7.8 (*)    GFR calc non Af Amer 50 (*)    GFR calc Af Amer 58 (*)    All other components within normal limits  CBG MONITORING, ED - Abnormal; Notable for the following:    Glucose-Capillary 190 (*)    All other components within normal limits  CULTURE, BLOOD (ROUTINE X 2)  CULTURE, BLOOD (ROUTINE X 2)  HCG, QUANTITATIVE, PREGNANCY  URINALYSIS, ROUTINE W REFLEX MICROSCOPIC  LACTIC ACID, PLASMA  POCT CBG (FASTING - GLUCOSE)-MANUAL ENTRY  TYPE AND SCREEN    Imaging Review Us Ob Comp Less 14 Wks  11/09/2013   CL[place]:  Lower abdominal and pelvic pain. Prior left salpingectomy for ectopic pregnancy.  EXAM: OBSTETRIC <14 WK US AND TRANSVAGINAL OB US  TECHNIQUE: Both transabdominal and transvaginal ultrasound examinations were performed for complete evaluation of the gestation as well as the maternal uterus, adnexal regions, and pelvic cul-de-sac. Transvaginal technique was performed to assess early pregnancy.  COMPARISON:  04/13/2011  FINDINGS: No intrauterine gestational sac or other fluid collection visualized in endometrial cavity. Endometrial thickness measures approximately 15 mm. No fibroids identified.  A simple right ovarian cysts is seen measuring 5.2 cm. There is also a adjacent complex hypoechoic mass in the right adnexa which measures approximately 3.7 x 4.4 cm. There is a moderate amount of complex free fluid seen surrounding the uterus, consistent with hemoperitoneum.  Left ovary not directly visualized on this study. This is likely due to previous left salpingo-oophorectomy.  IMPRESSION: 4 cm complex right adnexal mass and moderate amount of complex free fluid, highly suspicious for ectopic pregnancy and associated hemoperitoneum.   Critical Value/emergent results were called by telephone at the time of interpretation on 11/09/2013 at 3:43 PM to Dr. Estell HarpinZammit in the ED, who verbally acknowledged these results.   Electronically Signed   By: Myles RosenthalJohn  Stahl M.D.   On: 11/09/2013 15:48   [place] Ob Transvaginal  11/09/2013   CLINICAL DATA:  Lower abdominal and pelvic pain. Prior left salpingectomy for ectopic pregnancy.  EXAM: OBSTETRIC <14 WK US AND TRANSVAGINAL OB US  TECHNIQUE: Both transabdominal and transvaginal ultrasound examinations were performed for complete evaluation of the gestation as well as the maternal uterus, adnexal regions, and pelvic cul-de-sac. Transvaginal technique was performed to assess early pregnancy.  COMPARISON:  04/13/2011  FINDINGS: No intrauterine gestational sac or other fluid collection visualized in endometrial cavity. Endometrial thickness measures approximately 15 mm. No fibroids identified.  A simple right ovarian cysts is seen measuring 5.2 cm. There is also a adjacent complex hypoechoic mass in the right adnexa which measures approximately 3.7 x 4.4 cm. There is a moderate amount of complex free fluid seen surrounding the uterus, consistent with hemoperitoneum.  Left ovary not directly visualized on  this study. This is likely due to previous left salpingo-oophorectomy.  IMPRESSION: 4 cm complex right adnexal mass and moderate amount of complex free fluid, highly suspicious for ectopic pregnancy and associated hemoperitoneum.  Critical Value/emergent results were called by telephone at the time of interpretation on 11/09/2013 at 3:43 PM to Dr. Estell HarpinZammit in the ED, who verbally acknowledged these results.   Electronically Signed   By: Myles RosenthalJohn  Stahl M.D.   On: 11/09/2013 15:48     EKG Interpretation None      MDM   Final diagnoses:  Ectopic pregnancy  Syncope  Hypotension    28yF with syncope. Tachycardic/hypotensive. Would not expect hypotension to this degree after just a few episodes of vomiting in otherwise  healthy 28 year old. Reports positive pregnancy test. Past hx of ectopic pregnancy s/p left salpingectomy in 2007. LMP "some time in March." Lower abdominal tenderness. I could not identify an IUP on my bedside US. Concern for ectopic. US paged. IV x2. Bolus.   2:35 PM Empiric abx for possible sepsis ordered. BP improved with IVF but persistent sinus tach 120-130 and rigors. US tech at bedside.   3:56 PM Given message that radiology called reporting ectopic pregnancy. Discussed with Dr Despina HiddenEure, OB.GYN. To OR. Pt/family updated.   I personally preformed the services scribed in my presence. The recorded information has been reviewed is accurate. Raeford RazorStephen Ashtyn Freilich, MD.    Raeford RazorStephen Keeghan Bialy, MD 11/10/13 [phone]

## 2013-11-09 NOTE — ED Notes (Signed)
Pt received one liter from EMS prior to arrival, and second normal saline bolus in ED just started.

## 2013-11-09 NOTE — ED Notes (Signed)
Dr. Eure at bedside. 

## 2013-11-09 NOTE — ED Notes (Signed)
Pt states this is her fifth pregnancy. She has had a tubal pregnancy, and two miscarriages

## 2013-11-10 ENCOUNTER — Encounter (HOSPITAL_COMMUNITY): Payer: Self-pay | Admitting: *Deleted

## 2013-11-10 LAB — TYPE AND SCREEN
ABO/RH(D): O POS
Antibody Screen: NEGATIVE
Unit division: 0
Unit division: 0

## 2013-11-10 LAB — CBC
HEMATOCRIT: 24.3 % — AB (ref 36.0–46.0)
HEMOGLOBIN: 8.5 g/dL — AB (ref 12.0–15.0)
MCH: 29.7 pg (ref 26.0–34.0)
MCHC: 35 g/dL (ref 30.0–36.0)
MCV: 85 fL (ref 78.0–100.0)
Platelets: 218 10*3/uL (ref 150–400)
RBC: 2.86 MIL/uL — AB (ref 3.87–5.11)
RDW: 13.8 % (ref 11.5–15.5)
WBC: 15.3 10*3/uL — ABNORMAL HIGH (ref 4.0–10.5)

## 2013-11-10 LAB — BASIC METABOLIC PANEL
BUN: 7 mg/dL (ref 6–23)
CHLORIDE: 106 meq/L (ref 96–112)
CO2: 21 mEq/L (ref 19–32)
Calcium: 7.4 mg/dL — ABNORMAL LOW (ref 8.4–10.5)
Creatinine, Ser: 0.69 mg/dL (ref 0.50–1.10)
GFR calc Af Amer: >90 mL/min (ref >90)
GFR calc non Af Amer: >90 mL/min (ref >90)
GLUCOSE: 141 mg/dL — AB (ref 70–99)
Potassium: 3.6 mEq/L — ABNORMAL LOW (ref 3.7–5.3)
SODIUM: 139 meq/L (ref 137–147)

## 2013-11-10 NOTE — Anesthesia Postprocedure Evaluation (Signed)
  Anesthesia Post-op Note  Patient: Darlene Vazquez  Procedure(s) Performed: Procedure(s): LAPAROSCOPY OPERATIVE (N/A) UNILATERAL SALPINGECTOMY (Right) DIAGNOSTIC LAPAROSCOPY WITH REMOVAL OF ECTOPIC PREGNANCY (Right)  Patient Location: room 324  Anesthesia Type:General  Level of Consciousness: awake, alert , oriented and patient cooperative  Airway and Oxygen Therapy: Patient Spontanous Breathing  Post-op Pain: 5 /10, moderate  Post-op Assessment: Post-op Vital signs reviewed, Patient's Cardiovascular Status Stable, Respiratory Function Stable, Patent Airway and No signs of Nausea or vomiting  Post-op Vital Signs: Reviewed and stable  Last Vitals:  Filed Vitals:   11/10/13 1511  BP: 124/71  Pulse: 130  Temp: 37.2 C  Resp: 22    Complications: No apparent anesthesia complications

## 2013-11-10 NOTE — Addendum Note (Signed)
Addendum created 11/10/13 1530 by Despina Hiddenobert J Jalan Fariss, CRNA   Modules edited: Notes Section   Notes Section:  File: [id number]

## 2013-11-10 NOTE — Progress Notes (Signed)
Paged Dr. Despina HiddenEure regarding patient's need for indwelling urinary catheter. MD responded and gave order to remove catheter.

## 2013-11-11 LAB — CBC
HCT: 23.6 % — ABNORMAL LOW (ref 36.0–46.0)
HEMOGLOBIN: 8.2 g/dL — AB (ref 12.0–15.0)
MCH: 29.8 pg (ref 26.0–34.0)
MCHC: 34.7 g/dL (ref 30.0–36.0)
MCV: 85.8 fL (ref 78.0–100.0)
Platelets: 210 10*3/uL (ref 150–400)
RBC: 2.75 MIL/uL — AB (ref 3.87–5.11)
RDW: 13.9 % (ref 11.5–15.5)
WBC: 12.1 10*3/uL — ABNORMAL HIGH (ref 4.0–10.5)

## 2013-11-11 MED ORDER — IBUPROFEN 800 MG PO TABS: 800.0000 mg | ORAL_TABLET | Freq: Three times a day (TID) | ORAL | Status: DC | PRN

## 2013-11-11 MED ORDER — HYDROCODONE-ACETAMINOPHEN 5-325 MG PO TABS: 1.0000 | ORAL_TABLET | Freq: Four times a day (QID) | ORAL | Status: DC | PRN

## 2013-11-11 NOTE — Discharge Instructions (Signed)
Ruptured Ectopic Pregnancy °An ectopic pregnancy is when the fertilized egg attaches (implants) outside the uterus. Most ectopic pregnancies occur in the fallopian tube. Rarely do ectopic pregnancies occur on the ovary, intestine, pelvis, or cervix. An ectopic pregnancy does not have the ability to develop into a normal, healthy baby.  °A ruptured ectopic pregnancy is one in which the fallopian tube gets torn or bursts and results in internal bleeding. Often there is intense abdominal pain, and sometimes, vaginal bleeding. Having an ectopic pregnancy can be a life-threatening experience. If left untreated, this dangerous condition can lead to a blood transfusion, abdominal surgery, or even death.  °CAUSES  °Damage to the fallopian tubes is the suspected cause in most ectopic pregnancies.  °RISK FACTORS °Depending on your circumstances, the amount of risk of having an ectopic pregnancy will vary. There are 3 categories that may help you identify whether you are potentially at risk. °High Risk °· You have gone through infertility treatment. °· You have had a previous ectopic pregnancy. °· You have had previous tubal surgery. °· You have had previous surgery to have the fallopian tubes tied (tubal ligation). °· You have tubal problems or diseases. °· You have been exposed to DES. DES is a medicine that was used until 1971 and had effects on babies whose mothers took the medicine. °· You become pregnant while using an intrauterine device (IUD) for birth control.  °Moderate Risk °· You have a history of infertility. °· You have a history of a sexually transmitted infection (STI). °· You have a history of pelvic inflammatory disease (PID). °· You have scarring from endometriosis. °· You have multiple sexual partners. °· You smoke.  °Low Risk °· You have had previous pelvic surgery. °· You use vaginal douching. °· You became sexually active before 30 years of age. °SYMPTOMS °An ectopic pregnancy should be suspected in  anyone who has missed a period and has abdominal pain or bleeding. °· You may experience normal pregnancy symptoms, such as: °· Nausea. °· Tiredness. °· Breast tenderness. °· Symptoms that are not normal include: °· Pain with intercourse. °· Irregular vaginal bleeding or spotting. °· Cramping or pain on one side, or in the lower abdomen. °· Fast heartbeat. °· Passing out while having a bowel movement. °· Symptoms of a ruptured ectopic pregnancy and internal bleeding may include: °· Sudden, severe pain in the abdomen and pelvis. °· Dizziness or fainting. °· Pain in the shoulder area. °DIAGNOSIS  °Tests that may be performed include: °· A pregnancy test. °· An ultrasound. °· Testing the specific level of pregnancy hormone in the bloodstream. °· Taking a sample of uterus tissue (dilation and curettage, D&C). °· Surgery to perform a visual exam of the inside of the abdomen using a lighted tube (laparoscopy). °TREATMENT  °Laparoscopic surgery or abdominal surgery is recommended for a ruptured ectopic pregnancy.  °· The whole fallopian tube may need to be removed (salpingectomy). °· If the tube is not too damaged, the tube may be saved, and the pregnancy will be surgically removed. In time, the tube may still function. °· If you have lost a lot of blood, you may need a blood transfusion. °· You may receive a RhoGAM shot if you are Rh negative and the father is Rh positive, or if you do not know the Rh type of the father. This is to prevent problems with any future pregnancy. °SEEK IMMEDIATE MEDICAL CARE IF:  °You have any symptoms of an ectopic or ruptured ectopic pregnancy. This is a medical   emergency. °Document Released: 06/17/2000 Document Revised: 09/12/2011 Document Reviewed: 04/01/2013 °Exit[employer]® Patient Information ©2014 [employer]. ° °

## 2013-11-11 NOTE — Plan of Care (Signed)
Problem: Discharge Progression Outcomes Goal: Staples/sutures removed Outcome: Not Met (add Reason) Pt to see Dr Elonda Husky 1 week

## 2013-11-11 NOTE — Progress Notes (Signed)
Utilization Review Complete  

## 2013-11-11 NOTE — Discharge Summary (Signed)
Physician Discharge Summary  Patient ID: Darlene Vazquez MRN: [id number] DOB/AGE: 30/10/1985 30 y.o.  Admit date: 11/09/2013 Discharge date: 11/11/2013  Admission Diagnoses: Ruptured ectopic pregnancy right Discharge Diagnoses:  Active Problems:   Ruptured ectopic pregnancy   Discharged Condition: stable  Hospital Course: s/p laparoscopy unremarkable course  Consults: None  Significant Diagnostic Studies: labs: HCG and sonogram  Treatments: surgery: laparoscopic removal of ectopic with right salpingectomy  Discharge Exam: Blood pressure 139/81, pulse 141, temperature 99.1 F (37.3 C), temperature source Oral, resp. rate 20, height 5\' 4"  (1.626 m), weight 190 lb 7.6 oz (86.4 kg), SpO2 96.00%. General appearance: alert, cooperative and no distress GI: soft, non-tender; bowel sounds normal; no masses,  no organomegaly  Disposition: 01-Home or Self Care  Discharge Orders   Future Orders Complete By Expires   Call MD for:  persistant nausea and vomiting  As directed    Call MD for:  severe uncontrolled pain  As directed    Call MD for:  temperature >100.4  As directed    Diet - low sodium heart healthy  As directed    Discharge wound care:  As directed    Driving Restrictions  As directed    Increase activity slowly  As directed    Lifting restrictions  As directed    Sexual Activity Restrictions  As directed        Medication List         HYDROcodone-acetaminophen 5-325 MG per tablet  Commonly known as:  NORCO/VICODIN  Take 1 tablet by mouth every 6 (six) hours as needed.     ibuprofen 800 MG tablet  Commonly known as:  ADVIL,MOTRIN  Take 1 tablet (800 mg total) by mouth every 8 (eight) hours as needed.     multivitamin-prenatal 27-0.8 MG Tabs tablet  Take 1 tablet by mouth daily.           Follow-up Information   Follow up with Lazaro ArmsEURE,Leighanna Kirn H, MD In 1 week.   Specialties:  Obstetrics and Gynecology, Radiology   Contact information:   [address]  [place] [id number] [phone]       Signed: Lazaro ArmsLuther H Asberry [place]cola 11/11/2013, 9:57 AM

## 2013-11-11 NOTE — Addendum Note (Signed)
Addendum created 11/11/13 0932 by Despina Hiddenobert J Laterrica Libman, CRNA   Modules edited: Charges VN

## 2013-11-12 ENCOUNTER — Encounter (HOSPITAL_COMMUNITY): Payer: Self-pay | Admitting: Obstetrics & Gynecology

## 2013-11-14 LAB — CULTURE, BLOOD (ROUTINE X 2): Culture: NO GROWTH

## 2013-11-18 ENCOUNTER — Ambulatory Visit (INDEPENDENT_AMBULATORY_CARE_PROVIDER_SITE_OTHER): Payer: Managed Care, Other (non HMO) | Admitting: Obstetrics & Gynecology

## 2013-11-18 ENCOUNTER — Encounter: Payer: Self-pay | Admitting: Obstetrics & Gynecology

## 2013-11-18 ENCOUNTER — Encounter: Payer: Managed Care, Other (non HMO) | Admitting: Obstetrics & Gynecology

## 2013-11-18 VITALS — BP 140/80 | Wt 173.0 lb

## 2013-11-18 DIAGNOSIS — O009 Unspecified ectopic pregnancy without intrauterine pregnancy: Secondary | ICD-10-CM

## 2013-11-18 DIAGNOSIS — Z9889 Other specified postprocedural states: Secondary | ICD-10-CM

## 2013-11-18 NOTE — Progress Notes (Signed)
Patient ID: Darlene Vazquez, female   DOB: 04/22/1986, 28 y.o.   MRN: [id number] Pt is POD #8 from laparoscopic removal of ectopic pregnancy with removal of ectopic pregnancy, rupture 1500 CC hemoperitoneum Pt has no Fallopian tubes now  Pt doing well no complaints today Blood pressure 140/80, weight 173 lb (78.472 kg).  Incisions x 3 all look good Staples removed  No N/V/D No fever chills or other constitutional symptoms No urinary complaints  Impression  S/p laparosocpic removal of ectopic  Recommend  folow up as needed

## 2013-11-25 NOTE — Op Note (Signed)
Preoperative diagnosis:  right Ectopic pregnancy, ruptured                                         Near syncopal episode                                         Severe anemia secondary to ruptured ectopic pregnancy  Postoperative diagnosis: Ruptured right ectopic pregnancy with 1500 cc hemoperitoneum   Procedure: Laparoscopic removal of right ectopic pregnancy and right salpingectomy   Surgeon: Lazaro ArmsEURE,LUTHER H   Anesthesia: Gen. Endotracheal   Findings: The patient presented to pain and ER having had a near syncopal episode.  She woke up normally this morning had not had any other trouble previously but experienced severe right lower quadrant pain and the syncopal episode.  She was brought to the emergency room evaluated and stabilized And found to have a ruptured right ectopic pregnancy.  Ordered a blood transfusion perioperatively and proceeded with a laparoscopic approach to removal of the ectopic and stabilization the patient.  Intraoperatively the patient had about 1500 cc of mostly clotted blood and there was a active bleeding coming from the ectopic. The entire  tube was ruptured and was not salvageable. The left fallopian tube was surgically absent from a ectopic pregnancy 7 or 8 years previously.   Description of operation: Patient was taken to the operating room and placed in the supine position where she underwent general endotracheal anesthesia. She was placed in dorsal lithotomy position. She was prepped and draped in usual sterile fashion. Foley catheter was placed. Incision was made in the umbilicus and a varies needle was placed peritoneal cavity with one pass that difficulty. The peritoneal cavity was insufflated. A 1011 non-bladed trocar was placed using a video laparoscope under direct visualization without difficulty. An incision was made in the midline suprapubic area and left lower quadrant and 5 mm non-bladed trochars were placed in each site without difficulty under direct  visualization. There was a significant amount of blood in the belly the time of surgery and I estimated at 1500 cc judging by what I was able to suction out. There was still some adherent blood clot but I could not remove even at the end of surgery to I would say there was probably about 2000 cc total   The fallopian tube had been ruptured. Harmonic scalpel was used and a salpingectomy was performed. There was good hemostasis. The pelvis was irrigated and hemostasis once again confirmed. .   The trochars were removed and the gas was allowed to escape from the abdomen. The 25 mm trocar sites were closed with staples and injected with a total of 10 cc of exparel. The umbilical fascia was closed with single 2-0 Vicryl suture and the subcutaneous tissue was also closed using Vicryl. The skin was closed using skin staples. 10 cc of expael was injected here as well.   The patient remained hemodynamically stable throughout the entire procedure was awakened from anesthesia and taken to the recovery room in good stable condition with all counts being correct.   She received 2 g of Ancef and 30 mg of Toradol prophylactically. There was no real intraoperative blood loss only the hemoperitoneum which was appreciated and present upon peritoneal entry and the bleeding that was occurring because  of the ruptured ectopic.   Lazaro Arms

## 2013-12-16 ENCOUNTER — Telehealth: Payer: Self-pay | Admitting: *Deleted

## 2013-12-16 NOTE — Telephone Encounter (Signed)
Called pt and spoke with her.  Informed pt that I was faxing work note to [employer] f: [phone] per pt request.  Pt also requested that I keep a copy in our file up front for pick up on case she needs it in the future.  Faxed and filed.  12/16/13  AS

## 2013-12-16 NOTE — Telephone Encounter (Signed)
I spoke with Dr. Emelda FearFerguson and he advised it would be ok to give letter to return to work. Letter given to Elinor ParkinsonAngela Stallings in front office, she will call pt and let her know when the letter has been faxed.

## 2014-05-05 ENCOUNTER — Encounter: Payer: Self-pay | Admitting: Obstetrics & Gynecology

## 2014-05-20 ENCOUNTER — Emergency Department (HOSPITAL_COMMUNITY)
Admission: EM | Admit: 2014-05-20 | Discharge: 2014-05-20 | Disposition: A | Discharge status: Home / Self Care | Payer: Managed Care, Other (non HMO) | Attending: Emergency Medicine | Admitting: Emergency Medicine

## 2014-05-20 ENCOUNTER — Emergency Department (HOSPITAL_COMMUNITY): Payer: Managed Care, Other (non HMO)

## 2014-05-20 ENCOUNTER — Encounter (HOSPITAL_COMMUNITY): Payer: Self-pay | Admitting: *Deleted

## 2014-05-20 DIAGNOSIS — K5900 Constipation, unspecified: Secondary | ICD-10-CM | POA: Diagnosis not present

## 2014-05-20 DIAGNOSIS — R1010 Upper abdominal pain, unspecified: Secondary | ICD-10-CM | POA: Diagnosis present

## 2014-05-20 DIAGNOSIS — R109 Unspecified abdominal pain: Secondary | ICD-10-CM

## 2014-05-20 DIAGNOSIS — Z9079 Acquired absence of other genital organ(s): Secondary | ICD-10-CM | POA: Insufficient documentation

## 2014-05-20 DIAGNOSIS — R05 Cough: Secondary | ICD-10-CM | POA: Insufficient documentation

## 2014-05-20 DIAGNOSIS — Z3202 Encounter for pregnancy test, result negative: Secondary | ICD-10-CM | POA: Insufficient documentation

## 2014-05-20 DIAGNOSIS — K824 Cholesterolosis of gallbladder: Secondary | ICD-10-CM | POA: Diagnosis not present

## 2014-05-20 DIAGNOSIS — R197 Diarrhea, unspecified: Secondary | ICD-10-CM | POA: Insufficient documentation

## 2014-05-20 LAB — CBC WITH DIFFERENTIAL/PLATELET
Basophils Absolute: 0 10*3/uL (ref 0.0–0.1)
Basophils Relative: 1 % (ref 0–1)
EOS ABS: 0.1 10*3/uL (ref 0.0–0.7)
EOS PCT: 1 % (ref 0–5)
HEMATOCRIT: 39.4 % (ref 36.0–46.0)
Hemoglobin: 13.2 g/dL (ref 12.0–15.0)
LYMPHS PCT: 43 % (ref 12–46)
Lymphs Abs: 2.8 10*3/uL (ref 0.7–4.0)
MCH: 28.6 pg (ref 26.0–34.0)
MCHC: 33.5 g/dL (ref 30.0–36.0)
MCV: 85.3 fL (ref 78.0–100.0)
MONO ABS: 0.5 10*3/uL (ref 0.1–1.0)
Monocytes Relative: 7 % (ref 3–12)
Neutro Abs: 3.2 10*3/uL (ref 1.7–7.7)
Neutrophils Relative %: 48 % (ref 43–77)
Platelets: 383 10*3/uL (ref 150–400)
RBC: 4.62 MIL/uL (ref 3.87–5.11)
RDW: 14.3 % (ref 11.5–15.5)
WBC: 6.6 10*3/uL (ref 4.0–10.5)

## 2014-05-20 LAB — URINALYSIS, ROUTINE W REFLEX MICROSCOPIC
Bilirubin Urine: NEGATIVE
GLUCOSE, UA: NEGATIVE mg/dL
KETONES UR: NEGATIVE mg/dL
NITRITE: NEGATIVE
PROTEIN: NEGATIVE mg/dL
Specific Gravity, Urine: 1.025 (ref 1.005–1.030)
Urobilinogen, UA: 0.2 mg/dL (ref 0.0–1.0)
pH: 6.5 (ref 5.0–8.0)

## 2014-05-20 LAB — COMPREHENSIVE METABOLIC PANEL
ALT: 11 U/L (ref 0–35)
AST: 10 U/L (ref 0–37)
Albumin: 4.1 g/dL (ref 3.5–5.2)
Alkaline Phosphatase: 90 U/L (ref 39–117)
Anion gap: 12 (ref 5–15)
BUN: 8 mg/dL (ref 6–23)
CALCIUM: 9.4 mg/dL (ref 8.4–10.5)
CO2: 26 meq/L (ref 19–32)
CREATININE: 0.68 mg/dL (ref 0.50–1.10)
Chloride: 101 mEq/L (ref 96–112)
GFR calc Af Amer: >90 mL/min (ref >90)
Glucose, Bld: 95 mg/dL (ref 70–99)
Potassium: 4 mEq/L (ref 3.7–5.3)
SODIUM: 139 meq/L (ref 137–147)
TOTAL PROTEIN: 7.7 g/dL (ref 6.0–8.3)
Total Bilirubin: 0.4 mg/dL (ref 0.3–1.2)

## 2014-05-20 LAB — URINE MICROSCOPIC-ADD ON

## 2014-05-20 LAB — PREGNANCY, URINE: Preg Test, Ur: NEGATIVE

## 2014-05-20 LAB — LIPASE, BLOOD: LIPASE: 28 U/L (ref 11–59)

## 2014-05-20 MED ORDER — OMEPRAZOLE 20 MG PO CPDR: 20.0000 mg | DELAYED_RELEASE_CAPSULE | Freq: Every day | ORAL | Status: DC

## 2014-05-20 NOTE — ED Notes (Signed)
Upper abd pain for 3 weeks , worse after eating. No n/v,    Has diarrhea at times.  No fever or chills.

## 2014-05-20 NOTE — Discharge Instructions (Signed)

## 2014-05-20 NOTE — ED Provider Notes (Signed)
CSN: [id number]     Arrival date & time 05/20/14  1357 History  This chart was scribed for Linwood DibblesJon Kemari Mares, MD by Tonye RoyaltyJoshua Chen, ED Scribe. This patient was seen in room APA05/[facility]05 and the patient's care was started at 4:01 PM.    Chief Complaint  Patient presents with  . Abdominal Pain   The history is provided by the patient. No language interpreter was used.    HPI Comments: Darlene Vazquez is a 28 y.o. female who presents to the Emergency Department complaining of intermittent upper abdominal pain with onset 3 weeks ago. She states she initially thought it was due to constipation. She reports pain after eating to her upper abdomen, not radiating anywhere else. She states pain is worse with coughing. She reports some associated diarrhea. She denies previous abdominal surgeries except for surgery for a ruptured ectopic pregnancy 6 months ago. She denies fever, vomiting, urinary symptoms, chest pain, or SOB.  History reviewed. No pertinent past medical history. Past Surgical History  Procedure Laterality Date  . Laparoscopy N/A 11/09/2013    Procedure: LAPAROSCOPY OPERATIVE;  Surgeon: Lazaro ArmsLuther H Eure, MD;  Location: [facility];  Service: Gynecology;  Laterality: N/A;  . Unilateral salpingectomy Right 11/09/2013    Procedure: UNILATERAL SALPINGECTOMY;  Surgeon: Lazaro ArmsLuther H Eure, MD;  Location: [facility];  Service: Gynecology;  Laterality: Right;  . Diagnostic laparoscopy with removal of ectopic pregnancy Right 11/09/2013    Procedure: DIAGNOSTIC LAPAROSCOPY WITH REMOVAL OF ECTOPIC PREGNANCY;  Surgeon: Lazaro ArmsLuther H Eure, MD;  Location: [facility];  Service: Gynecology;  Laterality: Right;   History reviewed. No pertinent family history. History  Substance Use Topics  . Smoking status: Never Smoker   . Smokeless tobacco: Never Used  . Alcohol Use: No   OB History    Gravida Para Term Preterm AB TAB SAB Ectopic Multiple Living   1              Review of Systems  Constitutional: Negative for fever.  Respiratory:  Negative for shortness of breath.   Cardiovascular: Negative for chest pain.  Gastrointestinal: Positive for abdominal pain, diarrhea and constipation. Negative for nausea and vomiting.  Genitourinary: Negative for dysuria, urgency, frequency and difficulty urinating.      Allergies  Review of patient's allergies indicates no known allergies.  Home Medications   Prior to Admission medications   Medication Sig Start Date End Date Taking? Authorizing Provider  HYDROcodone-acetaminophen (NORCO/VICODIN) 5-325 MG per tablet Take 1 tablet by mouth every 6 (six) hours as needed. Patient not taking: Reported on 05/20/2014 11/11/13   Lazaro ArmsLuther H Eure, MD  ibuprofen (ADVIL,MOTRIN) 800 MG tablet Take 1 tablet (800 mg total) by mouth every 8 (eight) hours as needed. Patient not taking: Reported on 05/20/2014 11/11/13   Lazaro ArmsLuther H Eure, MD  omeprazole (PRILOSEC) 20 MG capsule Take 1 capsule (20 mg total) by mouth daily. 05/20/14   Linwood DibblesJon Jamarco Zaldivar, MD   BP 164/96 mmHg  Pulse 127  Temp(Src) 98 F (36.7 C) (Oral)  Resp 18  Ht 5\' 2"  (1.575 m)  Wt 165 lb (74.844 kg)  BMI 30.17 kg/m2  SpO2 100%  LMP 04/13/2014 (Approximate)  Breastfeeding? Unknown Physical Exam  ED Course  Procedures (including critical care time)  DIAGNOSTIC STUDIES: Oxygen Saturation is 100% on room air, normal by my interpretation.    COORDINATION OF CARE: 4:05 PM Discussed treatment plan with patient at beside, the patient agrees with the plan and has no further questions at this time.  Labs Review Labs Reviewed  URINALYSIS, ROUTINE W REFLEX MICROSCOPIC - Abnormal; Notable for the following:    APPearance HAZY (*)    Hgb urine dipstick SMALL (*)    Leukocytes, UA TRACE (*)    All other components within normal limits  URINE MICROSCOPIC-ADD ON - Abnormal; Notable for the following:    Squamous Epithelial / LPF FEW (*)    Bacteria, UA FEW (*)    All other components within normal limits  CBC WITH DIFFERENTIAL   COMPREHENSIVE METABOLIC PANEL  LIPASE, BLOOD  PREGNANCY, URINE    Imaging Review [place] Abdomen Complete  05/20/2014   CLINICAL DATA:  Abdominal pain  EXAM: ULTRASOUND ABDOMEN COMPLETE  COMPARISON:  None.  FINDINGS: Gallbladder: No gallstones or wall thickening visualized. 4 mm echogenic, non mobile, non shadowing lesion along the gallbladder wall likely representing a small gallbladder polyp. No sonographic Murphy sign noted.  Common bile duct: Diameter: 2.7 mm  Liver: No focal lesion identified. Within normal limits in parenchymal echogenicity.  IVC: No abnormality visualized.  Pancreas: Visualized portion unremarkable.  Spleen: Size and appearance within normal limits.  Right Kidney: Length: 9.6 cm. Echogenicity within normal limits. No mass or hydronephrosis visualized.  Left Kidney: Length: 10.8 cm. Echogenicity within normal limits. No mass or hydronephrosis visualized.  Abdominal aorta: No aneurysm visualized.  Other findings: None.  IMPRESSION: 1. No cholelithiasis or sonographic evidence of acute cholecystitis. 2. 4 mm gallbladder polyp.   Electronically Signed   By: Elige KoHetal  Patel   On: 05/20/2014 17:26      MDM   Final diagnoses:  Abdominal pain  Gallbladder polyp    The patient's laboratory tests revealed a gallbladder polyp but no evidence of gallstones or cholecystitis. Symptoms could be related to peptic ulcer disease. I will discharge her home on antacids and recommend she follow up with a gastroenterologist  I personally performed the services described in this documentation, which was scribed in my presence.  The recorded information has been reviewed and is accurate.    Linwood DibblesJon Isaiah Torok, MD 05/20/14 [phone]

## 2014-05-20 NOTE — ED Notes (Signed)
Ultrasound at bedside

## 2014-07-09 ENCOUNTER — Encounter: Payer: Self-pay | Admitting: Gastroenterology

## 2014-08-06 ENCOUNTER — Encounter (INDEPENDENT_AMBULATORY_CARE_PROVIDER_SITE_OTHER): Payer: Self-pay

## 2014-08-06 ENCOUNTER — Encounter: Payer: Self-pay | Admitting: Gastroenterology

## 2014-08-06 ENCOUNTER — Other Ambulatory Visit: Payer: Self-pay

## 2014-08-06 ENCOUNTER — Ambulatory Visit (INDEPENDENT_AMBULATORY_CARE_PROVIDER_SITE_OTHER): Payer: Managed Care, Other (non HMO) | Admitting: Gastroenterology

## 2014-08-06 VITALS — BP 154/99 | HR 126 | Temp 97.9°F | Ht 61.0 in | Wt 167.8 lb

## 2014-08-06 DIAGNOSIS — R1033 Periumbilical pain: Secondary | ICD-10-CM | POA: Insufficient documentation

## 2014-08-06 DIAGNOSIS — R1013 Epigastric pain: Secondary | ICD-10-CM

## 2014-08-06 MED ORDER — DEXLANSOPRAZOLE 60 MG PO CPDR: DELAYED_RELEASE_CAPSULE | ORAL | Status: DC

## 2014-08-06 NOTE — Progress Notes (Signed)
ON RECALL LIST  °

## 2014-08-06 NOTE — Patient Instructions (Signed)
ADD DEXILANT DAILY.  UPPER ENDOSCOPY NEXT TUES FEB 9  CONTINUE YOUR WEIGHT LOSS EFFORTS. LOSE 10 LBS.  STRICTLY FOLLOW A LOW FAT DIET. FOOD SHOULD BE BAKED, BOILED, OR BROILED. SEE INFO BELOW.  AVOID TRIGGERS FOR GASTRITIS. SEE INFO BELOW.  FOLLOW UP IN 4 MOS.   Gastritis  Gastritis is an inflammation (the body's way of reacting to injury and/or infection) of the stomach. It is often caused by bacterial (germ) infections. It can also be caused BY ASPIRIN, BC/GOODY POWDER'S, (IBUPROFEN) MOTRIN, OR ALEVE (NAPROXEN), chemicals (including alcohol), SPICY FOODS, and medications. This illness may be associated with generalized malaise (feeling tired, not well), UPPER ABDOMINAL STOMACH cramps, and fever. One common bacterial cause of gastritis is an organism known as H. Pylori. This can be treated with antibiotics.    Low-Fat Diet BREADS, CEREALS, PASTA, RICE, DRIED PEAS, AND BEANS These products are high in carbohydrates and most are low in fat. Therefore, they can be increased in the diet as substitutes for fatty foods. They too, however, contain calories and should not be eaten in excess. Cereals can be eaten for snacks as well as for breakfast.   FRUITS AND VEGETABLES It is good to eat fruits and vegetables. Besides being sources of fiber, both are rich in vitamins and some minerals. They help you get the daily allowances of these nutrients. Fruits and vegetables can be used for snacks and desserts.  MEATS Limit lean meat, chicken, [place], and fish to no more than 6 ounces per day. Beef, Pork, and Lamb Use lean cuts of beef, pork, and lamb. Lean cuts include:  Extra-lean ground beef.  Arm roast.  Sirloin tip.  Center-cut ham.  Round steak.  Loin chops.  Rump roast.  Tenderloin.  Trim all fat off the outside of meats before cooking. It is not necessary to severely decrease the intake of red meat, but lean choices should be made. Lean meat is rich in protein and contains a highly  absorbable form of iron. Premenopausal women, in particular, should avoid reducing lean red meat because this could increase the risk for low red blood cells (iron-deficiency anemia).  Chicken and [place] These are good sources of protein. The fat of poultry can be reduced by removing the skin and underlying fat layers before cooking. Chicken and [place] can be substituted for lean red meat in the diet. Poultry should not be fried or covered with high-fat sauces. Fish and Shellfish Fish is a good source of protein. Shellfish contain cholesterol, but they usually are low in saturated fatty acids. The preparation of fish is important. Like chicken and [place], they should not be fried or covered with high-fat sauces. EGGS Egg whites contain no fat or cholesterol. They can be eaten often. Try 1 to 2 egg whites instead of whole eggs in recipes or use egg substitutes that do not contain yolk. MILK AND DAIRY PRODUCTS Use skim or 1% milk instead of 2% or whole milk. Decrease whole milk, natural, and processed cheeses. Use nonfat or low-fat (2%) cottage cheese or low-fat cheeses made from vegetable oils. Choose nonfat or low-fat (1 to 2%) yogurt. Experiment with evaporated skim milk in recipes that call for heavy cream. Substitute low-fat yogurt or low-fat cottage cheese for sour cream in dips and salad dressings. Have at least 2 servings of low-fat dairy products, such as 2 glasses of skim (or 1%) milk each day to help get your daily calcium intake. FATS AND OILS Reduce the total intake of fats, especially  saturated fat. Butterfat, lard, and beef fats are high in saturated fat and cholesterol. These should be avoided as much as possible. Vegetable fats do not contain cholesterol, but certain vegetable fats, such as coconut oil, palm oil, and palm kernel oil are very high in saturated fats. These should be limited. These fats are often used in bakery goods, processed foods, popcorn, oils, and nondairy creamers.  Vegetable shortenings and some peanut butters contain hydrogenated oils, which are also saturated fats. Read the labels on these foods and check for saturated vegetable oils. Unsaturated vegetable oils and fats do not raise blood cholesterol. However, they should be limited because they are fats and are high in calories. Total fat should still be limited to 30% of your daily caloric intake. Desirable liquid vegetable oils are corn oil, cottonseed oil, olive oil, canola oil, safflower oil, soybean oil, and sunflower oil. Peanut oil is not as good, but small amounts are acceptable. Buy a heart-healthy tub margarine that has no partially hydrogenated oils in the ingredients. Mayonnaise and salad dressings often are made from unsaturated fats, but they should also be limited because of their high calorie and fat content. Seeds, nuts, peanut butter, olives, and avocados are high in fat, but the fat is mainly the unsaturated type. These foods should be limited mainly to avoid excess calories and fat. OTHER EATING TIPS Snacks  Most sweets should be limited as snacks. They tend to be rich in calories and fats, and their caloric content outweighs their nutritional value. Some good choices in snacks are graham crackers, melba toast, soda crackers, bagels (no egg), English muffins, fruits, and vegetables. These snacks are preferable to snack crackers, [place] fries, TORTILLA CHIPS, and POTATO chips. Popcorn should be air-popped or cooked in small amounts of liquid vegetable oil. Desserts Eat fruit, low-fat yogurt, and fruit ices instead of pastries, cake, and cookies. Sherbet, angel food cake, gelatin dessert, frozen low-fat yogurt, or other frozen products that do not contain saturated fat (pure fruit juice bars, frozen ice pops) are also acceptable.  COOKING METHODS Choose those methods that use little or no fat. They include: Poaching.  Braising.  Steaming.  Grilling.  Baking.  Stir-frying.  Broiling.   Microwaving.  Foods can be cooked in a nonstick pan without added fat, or use a nonfat cooking spray in regular cookware. Limit fried foods and avoid frying in saturated fat. Add moisture to lean meats by using water, broth, cooking wines, and other nonfat or low-fat sauces along with the cooking methods mentioned above. Soups and stews should be chilled after cooking. The fat that forms on top after a few hours in the refrigerator should be skimmed off. When preparing meals, avoid using excess salt. Salt can contribute to raising blood pressure in some people.  EATING AWAY FROM HOME Order entres, potatoes, and vegetables without sauces or butter. When meat exceeds the size of a deck of cards (3 to 4 ounces), the rest can be taken home for another meal. Choose vegetable or fruit salads and ask for low-calorie salad dressings to be served on the side. Use dressings sparingly. Limit high-fat toppings, such as bacon, crumbled eggs, cheese, sunflower seeds, and olives. Ask for heart-healthy tub margarine instead of butter.

## 2014-08-06 NOTE — Progress Notes (Signed)
   Subjective:    Patient ID: Darlene Vazquez, female    DOB: 03/10/1986, 29 y.o.   MRN: [id number]  [facility]  HPI SEVERE EPIGASTRIC PAIN AFTER EATING DOMINOS PIZZA. THEN WENT AND HAD SOME OJ. THEN HAD TO GO TO THE ED.  FEELS HEARTBURN. TAKES OMEPRAZOLE: MOST DAYS. WHEN SHE EATS SHE HAS PAIN ~10-15 MINS AND SOMETIMES IMMEDIATELY. WEIGHT DOWN.HAPPENS 3-4X/WEEK. SX LASTS RELIEVED WITH MAALOX AND MAY LAST UP TO 30 MINS. WHEN HAS PAIN MAY HAVE DIARRHEA. USES IBUPROFEN WHEN STOMACH HURTS. USU. BMs: 3-4 TIMES. EATS OME FIBER. DRINK SODA: PEPSI(2-3 12 OZ CANS). Problems with sedation: IF GETS UP TO FATS SHE WILL THROW UP. CONSTIPATION AFTER 2ND LAPAROSCOPY. NO ETOH OR CIGS.  PT DENIES FEVER, CHILLS, HEMATOCHEZIA, nausea, vomiting, melena, diarrhea, CHEST PAIN, SHORTNESS OF BREATH,  Constipation, OR problems swallowing,   Past Medical History  Diagnosis Date  . GERD (gastroesophageal reflux disease)    Past Surgical History  Procedure Laterality Date  . Laparoscopy N/A 11/09/2013    Procedure: LAPAROSCOPY OPERATIVE;  Surgeon: Lazaro ArmsLuther H Eure, MD;  Location: [facility];  Service: Gynecology;  Laterality: N/A;  . Unilateral salpingectomy Right 11/09/2013    Procedure: UNILATERAL SALPINGECTOMY;  Surgeon: Lazaro ArmsLuther H Eure, MD;  Location: [facility];  Service: Gynecology;  Laterality: Right;  . Diagnostic laparoscopy with removal of ectopic pregnancy Right 11/09/2013    Procedure: DIAGNOSTIC LAPAROSCOPY WITH REMOVAL OF ECTOPIC PREGNANCY;  Surgeon: Lazaro ArmsLuther H Eure, MD;  Location: [facility];  Service: Gynecology;  Laterality: Right;   No Known Allergies  Current Outpatient Prescriptions  Medication Sig Dispense Refill  . omeprazole (PRILOSEC) 20 MG capsule Take 1 capsule (20 mg total) by mouth daily.    .      .       Family History  Problem Relation Age of Onset  . Wilson's disease Maternal Grandfather   . Colon cancer Neg Hx   . Colon polyps Neg Hx    History  Substance Use Topics  . Smoking  status: Never Smoker   . Smokeless tobacco: Never Used  . Alcohol Use: No    Review of Systems PER HPI OTHERWISE ALL SYSTEMS ARE NEGATIVE.    Objective:   Physical Exam  Constitutional: She is oriented to person, place, and time. She appears well-developed and well-nourished. No distress.  HENT:  Head: Normocephalic and atraumatic.  Mouth/Throat: Oropharynx is clear and moist. No oropharyngeal exudate.  Eyes: Pupils are equal, round, and reactive to light. No scleral icterus.  Neck: Normal range of motion. Neck supple.  Cardiovascular: Normal rate, regular rhythm and normal heart sounds.   Pulmonary/Chest: Effort normal and breath sounds normal. No respiratory distress.  Abdominal: Soft. Bowel sounds are normal. She exhibits no distension. There is tenderness. There is no rebound and no guarding.  MOD TTP IN THE EPIGASTRIUM   Musculoskeletal: She exhibits no edema.  Lymphadenopathy:    She has no cervical adenopathy.  Neurological: She is alert and oriented to person, place, and time.  NO FOCAL DEFICITS   Psychiatric: She has a normal mood and affect.  Vitals reviewed.         Assessment & Plan:

## 2014-08-06 NOTE — Assessment & Plan Note (Signed)
MOST LIKELY DUE TO H PYLORI GASTRITIS, LESS LIKELY EOSINOPHILIC GASTRITIS  ADD DEXILANT DAILY EGD NEXT TUES FEB 9 CONTINUE YOUR WEIGHT LOSS EFFORTS. LOW FAT DIET AVOID TRIGGERS FOR GASTRITIS FOLLOW UP IN 4 MOS.

## 2014-08-12 ENCOUNTER — Encounter (HOSPITAL_COMMUNITY)
Admission: RE | Disposition: A | Discharge status: Home / Self Care | Payer: Self-pay | Source: Ambulatory Visit | Attending: Gastroenterology

## 2014-08-12 ENCOUNTER — Ambulatory Visit (HOSPITAL_COMMUNITY)
Admission: RE | Admit: 2014-08-12 | Discharge: 2014-08-12 | Disposition: A | Discharge status: Home / Self Care | Payer: Managed Care, Other (non HMO) | Source: Ambulatory Visit | Attending: Gastroenterology | Admitting: Gastroenterology

## 2014-08-12 ENCOUNTER — Encounter (HOSPITAL_COMMUNITY): Payer: Self-pay | Admitting: *Deleted

## 2014-08-12 DIAGNOSIS — R1013 Epigastric pain: Secondary | ICD-10-CM

## 2014-08-12 DIAGNOSIS — K2980 Duodenitis without bleeding: Secondary | ICD-10-CM | POA: Diagnosis not present

## 2014-08-12 DIAGNOSIS — K2970 Gastritis, unspecified, without bleeding: Secondary | ICD-10-CM | POA: Insufficient documentation

## 2014-08-12 DIAGNOSIS — K219 Gastro-esophageal reflux disease without esophagitis: Secondary | ICD-10-CM | POA: Diagnosis not present

## 2014-08-12 HISTORY — PX: ESOPHAGOGASTRODUODENOSCOPY: SHX5428

## 2014-08-12 SURGERY — EGD (ESOPHAGOGASTRODUODENOSCOPY)
Anesthesia: Moderate Sedation

## 2014-08-12 MED ORDER — MIDAZOLAM HCL 5 MG/5ML IJ SOLN
INTRAMUSCULAR | Status: AC
  Filled 2014-08-12: qty 10

## 2014-08-12 MED ORDER — MEPERIDINE HCL 100 MG/ML IJ SOLN
INTRAMUSCULAR | Status: DC | PRN
  Administered 2014-08-12: 50 mg via INTRAVENOUS
  Administered 2014-08-12: 25 mg via INTRAVENOUS

## 2014-08-12 MED ORDER — LIDOCAINE VISCOUS 2 % MT SOLN
OROMUCOSAL | Status: AC
  Filled 2014-08-12: qty 15

## 2014-08-12 MED ORDER — MEPERIDINE HCL 100 MG/ML IJ SOLN
INTRAMUSCULAR | Status: AC
  Filled 2014-08-12: qty 2

## 2014-08-12 MED ORDER — LIDOCAINE VISCOUS 2 % MT SOLN
OROMUCOSAL | Status: DC | PRN
  Administered 2014-08-12: 1 via OROMUCOSAL

## 2014-08-12 MED ORDER — PROMETHAZINE HCL 25 MG/ML IJ SOLN
INTRAMUSCULAR | Status: AC
  Filled 2014-08-12: qty 1

## 2014-08-12 MED ORDER — MIDAZOLAM HCL 5 MG/5ML IJ SOLN
INTRAMUSCULAR | Status: DC | PRN
  Administered 2014-08-12 (×2): 2 mg via INTRAVENOUS

## 2014-08-12 MED ORDER — PROMETHAZINE HCL 25 MG/ML IJ SOLN
12.5000 mg | Freq: Once | INTRAMUSCULAR | Status: AC
  Administered 2014-08-12: 12.5 mg via INTRAVENOUS

## 2014-08-12 MED ORDER — STERILE WATER FOR IRRIGATION IR SOLN
Status: DC | PRN
  Administered 2014-08-12: 13:00:00

## 2014-08-12 MED ORDER — SODIUM CHLORIDE 0.9 % IJ SOLN
INTRAMUSCULAR | Status: AC
  Filled 2014-08-12: qty 3

## 2014-08-12 MED ORDER — SODIUM CHLORIDE 0.9 % IV SOLN
INTRAVENOUS | Status: DC
  Administered 2014-08-12: 1000 mL via INTRAVENOUS

## 2014-08-12 NOTE — H&P (View-Only) (Signed)
   Subjective:    Patient ID: Darlene Vazquez, female    DOB: 05/11/1986, 29 y.o.   MRN: [id number]  [facility]  HPI SEVERE EPIGASTRIC PAIN AFTER EATING DOMINOS PIZZA. THEN WENT AND HAD SOME OJ. THEN HAD TO GO TO THE ED.  FEELS HEARTBURN. TAKES OMEPRAZOLE: MOST DAYS. WHEN SHE EATS SHE HAS PAIN ~10-15 MINS AND SOMETIMES IMMEDIATELY. WEIGHT DOWN.HAPPENS 3-4X/WEEK. SX LASTS RELIEVED WITH MAALOX AND MAY LAST UP TO 30 MINS. WHEN HAS PAIN MAY HAVE DIARRHEA. USES IBUPROFEN WHEN STOMACH HURTS. USU. BMs: 3-4 TIMES. EATS OME FIBER. DRINK SODA: PEPSI(2-3 12 OZ CANS). Problems with sedation: IF GETS UP TO FATS SHE WILL THROW UP. CONSTIPATION AFTER 2ND LAPAROSCOPY. NO ETOH OR CIGS.  PT DENIES FEVER, CHILLS, HEMATOCHEZIA, nausea, vomiting, melena, diarrhea, CHEST PAIN, SHORTNESS OF BREATH,  Constipation, OR problems swallowing,   Past Medical History  Diagnosis Date  . GERD (gastroesophageal reflux disease)    Past Surgical History  Procedure Laterality Date  . Laparoscopy N/A 11/09/2013    Procedure: LAPAROSCOPY OPERATIVE;  Surgeon: Lazaro ArmsLuther H Eure, MD;  Location: [facility];  Service: Gynecology;  Laterality: N/A;  . Unilateral salpingectomy Right 11/09/2013    Procedure: UNILATERAL SALPINGECTOMY;  Surgeon: Lazaro ArmsLuther H Eure, MD;  Location: [facility];  Service: Gynecology;  Laterality: Right;  . Diagnostic laparoscopy with removal of ectopic pregnancy Right 11/09/2013    Procedure: DIAGNOSTIC LAPAROSCOPY WITH REMOVAL OF ECTOPIC PREGNANCY;  Surgeon: Lazaro ArmsLuther H Eure, MD;  Location: [facility];  Service: Gynecology;  Laterality: Right;   No Known Allergies  Current Outpatient Prescriptions  Medication Sig Dispense Refill  . omeprazole (PRILOSEC) 20 MG capsule Take 1 capsule (20 mg total) by mouth daily.    .      .       Family History  Problem Relation Age of Onset  . Wilson's disease Maternal Grandfather   . Colon cancer Neg Hx   . Colon polyps Neg Hx    History  Substance Use Topics  . Smoking  status: Never Smoker   . Smokeless tobacco: Never Used  . Alcohol Use: No    Review of Systems PER HPI OTHERWISE ALL SYSTEMS ARE NEGATIVE.    Objective:   Physical Exam  Constitutional: She is oriented to person, place, and time. She appears well-developed and well-nourished. No distress.  HENT:  Head: Normocephalic and atraumatic.  Mouth/Throat: Oropharynx is clear and moist. No oropharyngeal exudate.  Eyes: Pupils are equal, round, and reactive to light. No scleral icterus.  Neck: Normal range of motion. Neck supple.  Cardiovascular: Normal rate, regular rhythm and normal heart sounds.   Pulmonary/Chest: Effort normal and breath sounds normal. No respiratory distress.  Abdominal: Soft. Bowel sounds are normal. She exhibits no distension. There is tenderness. There is no rebound and no guarding.  MOD TTP IN THE EPIGASTRIUM   Musculoskeletal: She exhibits no edema.  Lymphadenopathy:    She has no cervical adenopathy.  Neurological: She is alert and oriented to person, place, and time.  NO FOCAL DEFICITS   Psychiatric: She has a normal mood and affect.  Vitals reviewed.         Assessment & Plan:

## 2014-08-12 NOTE — Interval H&P Note (Signed)
History and Physical Interval Note:  08/12/2014 1:03 PM  Darlene Vazquez  has presented today for surgery, with the diagnosis of new onset dyspepsic  The various methods of treatment have been discussed with the patient and family. After consideration of risks, benefits and other options for treatment, the patient has consented to  Procedure(s) with comments: ESOPHAGOGASTRODUODENOSCOPY (EGD) (N/A) - 215 - moved to 12:45 - pt knows to arrive at 1:25 as a surgical intervention .  The patient's history has been reviewed, patient examined, no change in status, stable for surgery.  I have reviewed the patient's chart and labs.  Questions were answered to the patient's satisfaction.     [employer]aton CorporationSandi Fields

## 2014-08-13 NOTE — Op Note (Signed)
[facility] [place]:  Darlene Vazquez, Darlene Vazquez                ACCOUNT NO.:  [id number]  MEDICAL RECORD NO.:  [id number]  LOCATION:  [facility]                          FACILITY:  [facility]  PHYSICIAN:  Jonette EvaSandi Acelynn Dejonge, M.D.     DATE OF BIRTH:  08-19-1985  DATE OF PROCEDURE:  08/12/2014 DATE OF DISCHARGE:  08/12/2014                              OPERATIVE REPORT  PATH REPORT: GASTRITIS/DUODENITIS. CONSIDER CT PANCREATIC PROTOCOL IF SX DO NOT IMPROVE ON DEXILANT.  REF[facility]ING PROVID[facility]  [facility].  PROCEDURE:  Esophagogastroduodenoscopy with cold forceps biopsy of the gastric and duodenal mucosa.  INDICATION FOR EXAM:  Ms. Charmian MuffBrewer is a 29 year old female with a body mass index of 31, but weight loss from 173 pounds in May of 2015 to 167 pounds in February of 2016.  She presents with postprandial epigastric pain.  Symptoms started in November.  She presents for an EGD for new- onset dyspepsia.  FINDINGS: 1. Normal esophagus. 2. Mild erythema and edema in the antrum.  Cold forceps biopsies,     taken to evaluate for H. pylori or eosinophilic gastritis. 3. Normal-appearing duodenal mucosa in the bulb and second portion of     the duodenum.  Cold forceps biopsies obtained in the bulb and     second portion of the duodenum to evaluate for eosinophilic     duodenitis.  IMPRESSION: 1. No obvious source for epigastric pain identified. 2. Mild gastritis.  RECOMMENDATIONS: 1. Continue Dexilant daily. 2. Follow a low-fat diet. 3. Await biopsies. 4. I personally reviewed her abdominal ultrasound from November of     2015.  The pancreas was visualized and appeared normal.  We would     consider CT of the abdomen and pelvis pancreatic protocol if the     biopsies did not reveal any etiology for her epigastric pain. 5. She has a followup appointment to see me in June of 2016.  MEDICATIONS: 1. Demerol 75 mg IV. 2. Versed at 4 mg IV. 3. Phenergan 12.5 mg IV.  PROCEDURE TECHNIQUE:  Physical exam was performed.   Informed consent was obtained from the patient after explaining the benefits, risks and alternatives to the procedure.  The patient was connected to the monitored and placed in left lateral position.  Continuous oxygen was provided by nasal cannula and IV medicine administered through an indwelling cannula.  After administration of sedation, the patient's esophagus was intubated.  The scope was advanced under direct visualization to the second portion of the duodenum.  The scope was removed slowly by careful exam of color, texture, anatomy and integrity of the mucosa on the way out.  The patient was recovered in endoscopy and discharged home in satisfactory condition.     Jonette EvaSandi Zeva Leber, M.D.     SF/MEDQ  D:  08/12/2014  T:  08/13/2014  Job:  [id number]  cc:   Corrie MckusickJohn C. Golding, M.D. Fax: [phone]

## 2014-08-15 ENCOUNTER — Encounter (HOSPITAL_COMMUNITY): Payer: Self-pay | Admitting: Gastroenterology

## 2014-08-20 ENCOUNTER — Telehealth: Payer: Self-pay | Admitting: Gastroenterology

## 2014-08-20 NOTE — Telephone Encounter (Signed)
Pt is aware of results. Said she is not having any vomiting now and will let us know if she has problems.

## 2014-08-20 NOTE — Telephone Encounter (Signed)
[facility] and took her off [facility] schedule

## 2014-08-20 NOTE — Telephone Encounter (Signed)
Short Stay called to make OV in June with extender. Pt is aware of OV on 6/9 at 0830 with [facility].  Do I need to change it to be with SF or is this OK?

## 2014-08-20 NOTE — Discharge Instructions (Signed)
You have mild gastritis. I biopsied your stomach AND SMALL BOWEL.  CONTINUE DEXILANT.  AVOID TRIGGERS FOR GASTRITIS. SEE INFO BELOW.  YOUR BIOPSY RESULTS WILL BE AVAILABLE IN MY CHART AFTER FEB11 OR MY OFFICE WILL CONTACT YOU IN 10-14 DAYS WITH YOUR RESULTS.   FOLLOW A LOW FAT DIET. SEE INFO BELOW.  FOLLOW UP IN JUN 2016.  UPPER ENDOSCOPY AFTER CARE Read the instructions outlined below and refer to this sheet in the next week. These discharge instructions provide you with general information on caring for yourself after you leave the hospital. While your treatment has been planned according to the most current medical practices available, unavoidable complications occasionally occur. If you have any problems or questions after discharge, call DR. Kaiesha Tonner, [phone].  ACTIVITY  You may resume your regular activity, but move at a slower pace for the next 24 hours.   Take frequent rest periods for the next 24 hours.   Walking will help get rid of the air and reduce the bloated feeling in your belly (abdomen).   No driving for 24 hours (because of the medicine (anesthesia) used during the test).   You may shower.   Do not sign any important legal documents or operate any machinery for 24 hours (because of the anesthesia used during the test).    NUTRITION  Drink plenty of fluids.   You may resume your normal diet as instructed by your doctor.   Begin with a light meal and progress to your normal diet. Heavy or fried foods are harder to digest and may make you feel sick to your stomach (nauseated).   Avoid alcoholic beverages for 24 hours or as instructed.    MEDICATIONS  You may resume your normal medications.   WHAT YOU CAN EXPECT TODAY  Some feelings of bloating in the abdomen.   Passage of more gas than usual.    IF YOU HAD A BIOPSY TAKEN DURING THE UPPER ENDOSCOPY:  Eat a soft diet IF YOU HAVE NAUSEA, BLOATING, ABDOMINAL PAIN, OR VOMITING.    FINDING OUT  THE RESULTS OF YOUR TEST Not all test results are available during your visit. DR. Darrick Penna WILL CALL YOU WITHIN 14 DAYS OF YOUR PROCEDUE WITH YOUR RESULTS. Do not assume everything is normal if you have not heard from DR. Param Capri, CALL HER OFFICE AT [phone].  SEEK IMMEDIATE MEDICAL ATTENTION AND CALL THE OFFICE: [phone] IF:  You have more than a spotting of blood in your stool.   Your belly is swollen (abdominal distention).   You are nauseated or vomiting.   You have a temperature over 101F.   You have abdominal pain or discomfort that is severe or gets worse throughout the day.   Gastritis  Gastritis is an inflammation (the body's way of reacting to injury and/or infection) of the stomach. It is often caused by viral or bacterial (germ) infections. It can also be caused BY ASPIRIN, BC/GOODY POWDER'S, (IBUPROFEN) MOTRIN, OR ALEVE (NAPROXEN), chemicals (including alcohol), SPICY FOODS, and medications. This illness may be associated with generalized malaise (feeling tired, not well), UPPER ABDOMINAL STOMACH cramps, and fever. One common bacterial cause of gastritis is an organism known as H. Pylori. This can be treated with antibiotics.    Low-Fat Diet  BREADS, CEREALS, PASTA, RICE, DRIED PEAS, AND BEANS These products are high in carbohydrates and most are low in fat. Therefore, they can be increased in the diet as substitutes for fatty foods. They too, however, contain calories and should not  be eaten in excess. Cereals can be eaten for snacks as well as for breakfast.  Include foods that contain fiber (fruits, vegetables, whole grains, and legumes). Research shows that fiber may lower blood cholesterol levels, especially the water-soluble fiber found in fruits, vegetables, oat products, and legumes.  FRUITS AND VEGETABLES It is good to eat fruits and vegetables. Besides being sources of fiber, both are rich in vitamins and some minerals. They help you get the daily allowances  of these nutrients. Fruits and vegetables can be used for snacks and desserts.  MEATS Limit lean meat, chicken, [place], and fish to no more than 6 ounces per day.  Beef, Pork, and Lamb Use lean cuts of beef, pork, and lamb. Lean cuts include:  Extra-lean ground beef.  Arm roast.  Sirloin tip.  Center-cut ham.  Round steak.  Loin chops.  Rump roast.  Tenderloin.  Trim all fat off the outside of meats before cooking. It is not necessary to severely decrease the intake of red meat, but lean choices should be made. Lean meat is rich in protein and contains a highly absorbable form of iron. Premenopausal women, in particular, should avoid reducing lean red meat because this could increase the risk for low red blood cells (iron-deficiency anemia).  Chicken and [place] These are good sources of protein. The fat of poultry can be reduced by removing the skin and underlying fat layers before cooking. Chicken and [place] can be substituted for lean red meat in the diet. Poultry should not be fried or covered with high-fat sauces.  Fish and Shellfish Fish is a good source of protein. Shellfish contain cholesterol, but they usually are low in saturated fatty acids. The preparation of fish is important. Like chicken and [place], they should not be fried or covered with high-fat sauces.  EGGS Egg whites contain no fat or cholesterol. They can be eaten often. Try 1 to 2 egg whites instead of whole eggs in recipes or use egg substitutes that do not contain yolk.  MILK AND DAIRY PRODUCTS Use skim or 1% milk instead of 2% or whole milk. Decrease whole milk, natural, and processed cheeses. Use nonfat or low-fat (2%) cottage cheese or low-fat cheeses made from vegetable oils. Choose nonfat or low-fat (1 to 2%) yogurt. Experiment with evaporated skim milk in recipes that call for heavy cream. Substitute low-fat yogurt or low-fat cottage cheese for sour cream in dips and salad dressings. Have at least 2 servings  of low-fat dairy products, such as 2 glasses of skim (or 1%) milk each day to help get your daily calcium intake.  FATS AND OILS Reduce the total intake of fats, especially saturated fat. Butterfat, lard, and beef fats are high in saturated fat and cholesterol. These should be avoided as much as possible. Vegetable fats do not contain cholesterol, but certain vegetable fats, such as coconut oil, palm oil, and palm kernel oil are very high in saturated fats. These should be limited. These fats are often used in bakery goods, processed foods, popcorn, oils, and nondairy creamers. Vegetable shortenings and some peanut butters contain hydrogenated oils, which are also saturated fats. Read the labels on these foods and check for saturated vegetable oils.  Unsaturated vegetable oils and fats do not raise blood cholesterol. However, they should be limited because they are fats and are high in calories. Total fat should still be limited to 30% of your daily caloric intake. Desirable liquid vegetable oils are corn oil, cottonseed oil, olive oil, canola oil,  safflower oil, soybean oil, and sunflower oil. Peanut oil is not as good, but small amounts are acceptable. Buy a heart-healthy tub margarine that has no partially hydrogenated oils in the ingredients. Mayonnaise and salad dressings often are made from unsaturated fats, but they should also be limited because of their high calorie and fat content. Seeds, nuts, peanut butter, olives, and avocados are high in fat, but the fat is mainly the unsaturated type. These foods should be limited mainly to avoid excess calories and fat.  OTHER EATING TIPS Snacks  Most sweets should be limited as snacks. They tend to be rich in calories and fats, and their caloric content outweighs their nutritional value. Some good choices in snacks are graham crackers, melba toast, soda crackers, bagels (no egg), English muffins, fruits, and vegetables. These snacks are preferable to snack  crackers, [place] fries, and chips. Popcorn should be air-popped or cooked in small amounts of liquid vegetable oil.  Desserts Eat fruit, low-fat yogurt, and fruit ices instead of pastries, cake, and cookies. Sherbet, angel food cake, gelatin dessert, frozen low-fat yogurt, or other frozen products that do not contain saturated fat (pure fruit juice bars, frozen ice pops) are also acceptable.   COOKING METHODS Choose those methods that use little or no fat. They include: Poaching.  Braising.  Steaming.  Grilling.  Baking.  Stir-frying.  Broiling.  Microwaving.  Foods can be cooked in a nonstick pan without added fat, or use a nonfat cooking spray in regular cookware. Limit fried foods and avoid frying in saturated fat. Add moisture to lean meats by using water, broth, cooking wines, and other nonfat or low-fat sauces along with the cooking methods mentioned above. Soups and stews should be chilled after cooking. The fat that forms on top after a few hours in the refrigerator should be skimmed off. When preparing meals, avoid using excess salt. Salt can contribute to raising blood pressure in some people.  EATING AWAY FROM HOME Order entres, potatoes, and vegetables without sauces or butter. When meat exceeds the size of a deck of cards (3 to 4 ounces), the rest can be taken home for another meal. Choose vegetable or fruit salads and ask for low-calorie salad dressings to be served on the side. Use dressings sparingly. Limit high-fat toppings, such as bacon, crumbled eggs, cheese, sunflower seeds, and olives. Ask for heart-healthy tub margarine instead of butter.

## 2014-08-20 NOTE — Telephone Encounter (Signed)
Please call pt. HER BIOPSIES SHOW gastritis AND DUODENITIS. HER SYMPTOMS ARE MOST LIKELY DUE TO GASTRITIS/DUODENITIS AND REFLUX. SHE SHOULD CONTINUE DEXILANT. SHE SHOULD COMPLETE A GASTRIC EMPTYING STUDY IF SHE IS STILL VOMITING.  OP[facility] JUN 2016 E30 SLF VOMITING/GERD/gastritis AND DUODENITIS

## 2014-08-23 NOTE — Progress Notes (Signed)
CC'ED TO PCP 

## 2014-10-21 ENCOUNTER — Encounter: Payer: Self-pay | Admitting: Gastroenterology

## 2014-12-11 ENCOUNTER — Ambulatory Visit: Payer: Managed Care, Other (non HMO) | Admitting: Gastroenterology

## 2014-12-15 ENCOUNTER — Encounter (HOSPITAL_COMMUNITY): Payer: Self-pay | Admitting: Emergency Medicine

## 2014-12-15 ENCOUNTER — Emergency Department (HOSPITAL_COMMUNITY)
Admission: EM | Admit: 2014-12-15 | Discharge: 2014-12-15 | Disposition: A | Discharge status: Home / Self Care | Payer: Managed Care, Other (non HMO) | Attending: Emergency Medicine | Admitting: Emergency Medicine

## 2014-12-15 DIAGNOSIS — K219 Gastro-esophageal reflux disease without esophagitis: Secondary | ICD-10-CM | POA: Diagnosis not present

## 2014-12-15 DIAGNOSIS — Z79899 Other long term (current) drug therapy: Secondary | ICD-10-CM | POA: Diagnosis not present

## 2014-12-15 DIAGNOSIS — B084 Enteroviral vesicular stomatitis with exanthem: Secondary | ICD-10-CM | POA: Diagnosis not present

## 2014-12-15 DIAGNOSIS — R21 Rash and other nonspecific skin eruption: Secondary | ICD-10-CM | POA: Diagnosis present

## 2014-12-15 MED ORDER — NAPROXEN 500 MG PO TABS: 500.0000 mg | ORAL_TABLET | Freq: Two times a day (BID) | ORAL | Status: DC

## 2014-12-15 NOTE — ED Notes (Signed)
Patient states blood pressure is always elevated during doctor and hospital visits. States "my heart rate and blood pressure is always like that when I go to the doctor or the hospital. My doctor knows it and I check it at home and it is ok."

## 2014-12-15 NOTE — ED Notes (Signed)
Patient complaining of rash on hands and feet starting this morning.

## 2014-12-15 NOTE — ED Notes (Signed)
Patient sitting in room with gloves on.  Patient stated her blood pressure and pulse rate would not get better because she was stressed out about being here.  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

## 2014-12-15 NOTE — Discharge Instructions (Signed)

## 2014-12-17 NOTE — ED Provider Notes (Signed)
CSN: [id number]     Arrival date & time 12/15/14  0909 History   First MD Initiated Contact with Patient 12/15/14 0920     Chief Complaint  Patient presents with  . Rash     (Consider location/radiation/quality/duration/timing/severity/associated sxs/prior Treatment) HPI   Darlene Vazquez is a 29 y.o. female who presents to the Emergency Department complaining of sudden onset of rash to both feet and fingers and palms of both hands.  Onset this morning.  She describes the rash as slightly painful.  She has applied OTC first aid ointment without relief.  She denies fever, chills, sore throat, difficulty swallowing or breathing and vaginal lesions or pain.     Past Medical History  Diagnosis Date  . GERD (gastroesophageal reflux disease)    Past Surgical History  Procedure Laterality Date  . Laparoscopy N/A 11/09/2013    Procedure: LAPAROSCOPY OPERATIVE;  Surgeon: Lazaro Arms, MD;  Location: [facility];  Service: Gynecology;  Laterality: N/A;  . Unilateral salpingectomy Right 11/09/2013    Procedure: UNILATERAL SALPINGECTOMY;  Surgeon: Lazaro Arms, MD;  Location: [facility];  Service: Gynecology;  Laterality: Right;  . Diagnostic laparoscopy with removal of ectopic pregnancy Right 11/09/2013    Procedure: DIAGNOSTIC LAPAROSCOPY WITH REMOVAL OF ECTOPIC PREGNANCY;  Surgeon: Lazaro Arms, MD;  Location: [facility];  Service: Gynecology;  Laterality: Right;  . Esophagogastroduodenoscopy N/A 08/12/2014    Procedure: ESOPHAGOGASTRODUODENOSCOPY (EGD);  Surgeon: West Bali, MD;  Location: [facility];  Service: Endoscopy;  Laterality: N/A;  215 - moved to 12:45 - pt knows to arrive at 1:25   Family History  Problem Relation Age of Onset  . Wilson's disease Maternal Grandfather   . Colon cancer Neg Hx   . Colon polyps Neg Hx    History  Substance Use Topics  . Smoking status: Never Smoker   . Smokeless tobacco: Never Used  . Alcohol Use: No   OB History    Gravida Para Term Preterm AB TAB SAB  Ectopic Multiple Living   1              Review of Systems  Constitutional: Negative for fever, chills, activity change and appetite change.  HENT: Negative for facial swelling, sore throat and trouble swallowing.   Respiratory: Negative for chest tightness, shortness of breath and wheezing.   Musculoskeletal: Negative for neck pain and neck stiffness.  Skin: Positive for rash. Negative for wound.  Neurological: Negative for dizziness, weakness, numbness and headaches.  All other systems reviewed and are negative.     Allergies  Review of patient's allergies indicates no known allergies.  Home Medications   Prior to Admission medications   Medication Sig Start Date End Date Taking? Authorizing Provider  dexlansoprazole (DEXILANT) 60 MG capsule 1 PO EVERY MORNING WITH BREAKFAST. Patient taking differently: Take 60 mg by mouth daily. . 08/06/14  Yes West Bali, MD  naproxen (NAPROSYN) 500 MG tablet Take 1 tablet (500 mg total) by mouth 2 (two) times daily with a meal. As needed for pain 12/15/14   Nema Oatley, PA-C   BP 141/96 mmHg  Pulse 99  Temp(Src) 98.2 F (36.8 C) (Oral)  Resp 18  Ht  (1.549 m)  Wt 172 lb (78.019 kg)  BMI 32.52 kg/m2  SpO2 99%  LMP 12/14/2014 Physical Exam  Constitutional: She is oriented to person, place, and time. She appears well-developed and well-nourished. No distress.  HENT:  Head: Normocephalic and atraumatic.  Mouth/Throat:  Uvula is midline, oropharynx is clear and moist and mucous membranes are normal. No oral lesions. No uvula swelling.  Neck: Normal range of motion. Neck supple.  Cardiovascular: Normal rate, regular rhythm, normal heart sounds and intact distal pulses.   No murmur heard. Pulmonary/Chest: Effort normal and breath sounds normal. No respiratory distress. She has no wheezes.  Musculoskeletal: She exhibits no edema or tenderness.  Lymphadenopathy:    She has no cervical adenopathy.  Neurological: She is alert and  oriented to person, place, and time. She exhibits normal muscle tone. Coordination normal.  Skin: Skin is warm. Rash noted. There is erythema.  Erythematous , slightly ecchymotic appearing lesions to bilateral fingers, palms and plantar feet.  No vesicles, no pustules.    Nursing note and vitals reviewed.   ED Course  Procedures (including critical care time) Labs Review Labs Reviewed - No data to display  Imaging Review No results found.   EKG Interpretation None      MDM   Final diagnoses:  Hand, foot and mouth disease    Pt is well appearing.  Rash to bilateral hands and feet appear c/w hand, foot and mouth.  No lesions of the mouth.  Pt reassured and appears stable for d/c    Pauline Aus, PA-C 12/17/14 1818  Azalia Bilis, MD 12/19/14 [phone]

## 2014-12-24 ENCOUNTER — Ambulatory Visit: Payer: Managed Care, Other (non HMO) | Admitting: Gastroenterology

## 2015-02-12 ENCOUNTER — Ambulatory Visit (INDEPENDENT_AMBULATORY_CARE_PROVIDER_SITE_OTHER): Payer: Managed Care, Other (non HMO) | Admitting: Gastroenterology

## 2015-02-12 ENCOUNTER — Encounter: Payer: Self-pay | Admitting: Gastroenterology

## 2015-02-12 VITALS — BP 149/93 | HR 111 | Temp 97.8°F | Ht 63.0 in | Wt 170.8 lb

## 2015-02-12 DIAGNOSIS — R1013 Epigastric pain: Secondary | ICD-10-CM | POA: Diagnosis not present

## 2015-02-12 MED ORDER — PANTOPRAZOLE SODIUM 40 MG PO TBEC: DELAYED_RELEASE_TABLET | ORAL | Status: DC

## 2015-02-12 NOTE — Progress Notes (Signed)
cc'ed to pcp °

## 2015-02-12 NOTE — Patient Instructions (Addendum)
AVOID REFLUX TRIGGERS. SEE INFO BELOW.  CHANGE TO PROTONIX 30 MINUTES PRIOR TO BREAKFAST.  EAT A SNACK TO PREVENT GOING MORE THAN  4 HOURS WITHOUT EATING.  YOUR UPPER ABDOMINAL PAIN IS MOST LIKELY DUE TO A VENTRAL HENRIA.  GO TO ED OR CONSIDER SURGERY FOR PERSISTENT ABDOMINAL PAIN, NAUSEA, OR VOMITING.    PLEASE CALL WITH QUESTIONS OR CONCERNS OR GO TO ED.  FOLLOW UP IN 6 MOS.   Lifestyle and home remedies TO HELP CONTROL HEARTBURN.  You may eliminate or reduce the frequency of heartburn by making the following lifestyle changes:  . Control your weight. Being overweight is a major risk factor for heartburn and GERD. Excess pounds put pressure on your abdomen, pushing up your stomach and causing acid to back up into your esophagus.   . Eat smaller meals. 4 TO 6 MEALS A DAY. This reduces pressure on the lower esophageal sphincter, helping to prevent the valve from opening and acid from washing back into your esophagus.   Allena Earing your belt. Clothes that fit tightly around your waist put pressure on your abdomen and the lower esophageal sphincter.  .  . Eliminate heartburn triggers. Everyone has specific triggers.  .   Common triggers such as fatty or fried foods, spicy food, tomato sauce, carbonated beverages, alcohol, chocolate, mint, garlic, onion, caffeine and nicotine may make heartburn worse.   Marland Kitchen Avoid stooping or bending. Tying your shoes is OK. Bending over for longer periods to weed your garden isn't, especially soon after eating.   . Don't lie down after a meal. Wait at least three to four hours after eating before going to bed, and don't lie down right after eating.   Marland Kitchen PLACE THE HEAD OF YOUR BED ON 6 INCH BLOCKS.  Alternative medicine . Several home remedies exist for treating GERD, but they provide only temporary relief. They include drinking baking soda (sodium bicarbonate) added to water or drinking other fluids such as baking soda mixed with cream of tartar and  water. . Although these liquids create temporary relief by neutralizing, washing away or buffering acids, eventually they aggravate the situation by adding gas and fluid to your stomach, increasing pressure and causing more acid reflux. Further, adding more sodium to your diet may increase your blood pressure and add stress to your heart, and excessive bicarbonate ingestion can alter the acid-base balance in your body.

## 2015-02-12 NOTE — Progress Notes (Signed)
ON RECALL LIST  °

## 2015-02-12 NOTE — Assessment & Plan Note (Signed)
SYMPTOMS FAIRLY WELL CONTROLLED. ACUTE PAIN 1-2 TIMES A MO IF SHE DOESN'T EAT IS MOST LIKELY DUE TO PARTIALLY INCARCERATED VENTRAL HERNIA, LESS LIKELY REFLUX OR GASTRITIS.  DEXILANT TOO EXPENSIVE. CHANGE TO PROTONIX DAILY. EAT A SNACK TO PREVENT GOING > 4 HOURS WITHOUT EATING DISCUSSED VENTRAL HENRIA AND SYMPTOMS/RISKS OF INCARCERATION, AND MANAGEMENT OF WITH SURGERY. PT WILL CALL WITH QUESTIONS OR CONCERNS OR GO TO ED. FOLLOW UP IN 6 MOS.   GREATER THAN 50% WAS SPENT IN COUNSELING & COORDINATION OF CARE WITH THE PATIENT: DISCUSSED VENTRAL HERNIA RISKS, AND MANAGEMENT. TOTAL ENCOUNTER TIME: 15 MINS.

## 2015-02-12 NOTE — Progress Notes (Signed)
   Subjective:    Patient ID: Darlene Vazquez, female    DOB: 05/20/1986, 29 y.o.   MRN: [id number] [facility]  HPI Feeling better. UPPER ABDOMINAL PAIN IF SHE DOESN'T EAT REGULARLY: 1-2X/MO. HEARTBURN CONTROLLED. BMS: DAILY WITH TAKING ADVANCE FIBER GUMMIES.   PT DENIES FEVER, CHILLS, HEMATOCHEZIA, nausea, vomiting, melena, diarrhea, CHEST PAIN, SHORTNESS OF BREATH,  CHANGE IN BOWEL IN HABITS, constipation, problems swallowing, OR heartburn or indigestion.  Past Medical History  Diagnosis Date  . GERD (gastroesophageal reflux disease)     Past Surgical History  Procedure Laterality Date  . Laparoscopy N/A 11/09/2013    Procedure: LAPAROSCOPY OPERATIVE;  Surgeon: Lazaro Arms, MD;  Location: [facility];  Service: Gynecology;  Laterality: N/A;  . Unilateral salpingectomy Right 11/09/2013    Procedure: UNILATERAL SALPINGECTOMY;  Surgeon: Lazaro Arms, MD;  Location: [facility];  Service: Gynecology;  Laterality: Right;  . Diagnostic laparoscopy with removal of ectopic pregnancy Right 11/09/2013    Procedure: DIAGNOSTIC LAPAROSCOPY WITH REMOVAL OF ECTOPIC PREGNANCY;  Surgeon: Lazaro Arms, MD;  Location: [facility];  Service: Gynecology;  Laterality: Right;  . Esophagogastroduodenoscopy N/A 08/12/2014    SLF: 1. No obvious source for epigastric pain identified. 2. Mild gastiritis.     No Known Allergies  Current Outpatient Prescriptions  Medication Sig Dispense Refill  . dexlansoprazole (DEXILANT) 60 MG capsule 1 PO EVERY MORNING WITH BREAKFAST.     Marland Kitchen       Review of Systems PER HPI OTHERWISE ALL SYSTEMS ARE NEGATIVE.    Objective:   Physical Exam  Constitutional: She is oriented to person, place, and time. She appears well-developed and well-nourished. No distress.  HENT:  Head: Normocephalic and atraumatic.  Mouth/Throat: Oropharynx is clear and moist. No oropharyngeal exudate.  Eyes: Pupils are equal, round, and reactive to light. No  scleral icterus.  Neck: Normal range of motion. Neck supple.  Cardiovascular: Normal rate, regular rhythm and normal heart sounds.   Pulmonary/Chest: Effort normal and breath sounds normal. No respiratory distress.  Abdominal: Soft. Bowel sounds are normal. She exhibits no distension. There is no tenderness.   NONTENDER MIDLINE BULGE THAT INCREASES WITH VALSALVA, REDUCIBLE   Musculoskeletal: She exhibits no edema.  Lymphadenopathy:    She has no cervical adenopathy.  Neurological: She is alert and oriented to person, place, and time.  Psychiatric: She has a normal mood and affect.  Vitals reviewed.         Assessment & Plan:

## 2015-07-20 ENCOUNTER — Encounter: Payer: Self-pay | Admitting: Gastroenterology

## 2015-11-15 ENCOUNTER — Encounter (HOSPITAL_COMMUNITY): Payer: Self-pay | Admitting: Emergency Medicine

## 2015-11-15 ENCOUNTER — Ambulatory Visit (HOSPITAL_COMMUNITY)
Admission: EM | Admit: 2015-11-15 | Discharge: 2015-11-15 | Disposition: A | Discharge status: Home / Self Care | Payer: BLUE CROSS/BLUE SHIELD | Attending: Family Medicine | Admitting: Family Medicine

## 2015-11-15 DIAGNOSIS — J029 Acute pharyngitis, unspecified: Secondary | ICD-10-CM | POA: Diagnosis not present

## 2015-11-15 DIAGNOSIS — R000 Tachycardia, unspecified: Secondary | ICD-10-CM | POA: Diagnosis not present

## 2015-11-15 LAB — POCT RAPID STREP A: Streptococcus, Group A Screen (Direct): NEGATIVE

## 2015-11-15 NOTE — ED Provider Notes (Addendum)
CSN: [id number]     Arrival date & time 11/15/15  1908 History   First MD Initiated Contact with Patient 11/15/15 1922     No chief complaint on file.  (Consider location/radiation/quality/duration/timing/severity/associated sxs/prior Treatment) Patient is a 30 y.o. female presenting with pharyngitis and ear pain. The history is provided by the patient. No language interpreter was used.  Sore Throat This is a new problem. Episode onset: c/o sorethroat for 3 days. The problem occurs constantly. The problem has been gradually worsening. Pertinent negatives include no chest pain, no abdominal pain, no headaches and no shortness of breath. Associated symptoms comments: No cough,no fever,in church there was someone with strep throat infection. The symptoms are aggravated by swallowing. Nothing relieves the symptoms. Treatments tried: OTC sore throat spray. The treatment provided no relief.  Otalgia Location:  Bilateral (Started 3 days ago, triggered by swallowing.) Behind ear:  No abnormality Quality:  Sharp Severity:  Moderate Onset quality:  Gradual Duration:  3 days Timing:  Intermittent Progression:  Waxing and waning Chronicity:  New Context: not direct blow, not elevation change, not foreign body in ear and no water in ear   Relieved by:  Nothing Worsened by:  Nothing tried Associated symptoms: sore throat   Associated symptoms: no abdominal pain, no congestion, no cough, no diarrhea, no ear discharge, no fever, no headaches and no vomiting   Tachycardia: She denies chest pain, no palpitations, she stated she always gets nervous at the doctor's office.  Past Medical History  Diagnosis Date  . GERD (gastroesophageal reflux disease)    Past Surgical History  Procedure Laterality Date  . Laparoscopy N/A 11/09/2013    Procedure: LAPAROSCOPY OPERATIVE;  Surgeon: Lazaro Arms, MD;  Location: [facility];  Service: Gynecology;  Laterality: N/A;  . Unilateral salpingectomy Right 11/09/2013   Procedure: UNILATERAL SALPINGECTOMY;  Surgeon: Lazaro Arms, MD;  Location: [facility];  Service: Gynecology;  Laterality: Right;  . Diagnostic laparoscopy with removal of ectopic pregnancy Right 11/09/2013    Procedure: DIAGNOSTIC LAPAROSCOPY WITH REMOVAL OF ECTOPIC PREGNANCY;  Surgeon: Lazaro Arms, MD;  Location: [facility];  Service: Gynecology;  Laterality: Right;  . Esophagogastroduodenoscopy N/A 08/12/2014    SLF: 1. No obvious source for epigastric pain identified. 2. Mild gastiritis.    Family History  Problem Relation Age of Onset  . Wilson's disease Maternal Grandfather   . Colon cancer Neg Hx   . Colon polyps Neg Hx    Social History  Substance Use Topics  . Smoking status: Never Smoker   . Smokeless tobacco: Never Used     Comment: Never smoked  . Alcohol Use: No   OB History    Gravida Para Term Preterm AB TAB SAB Ectopic Multiple Living   1              Review of Systems  Constitutional: Negative for fever.  HENT: Positive for ear pain and sore throat. Negative for congestion and ear discharge.   Respiratory: Negative for cough and shortness of breath.   Cardiovascular: Negative for chest pain, palpitations and leg swelling.  Gastrointestinal: Negative for vomiting, abdominal pain and diarrhea.  Neurological: Negative for headaches.    Allergies  Review of patient's allergies indicates no known allergies.  Home Medications   Prior to Admission medications   Medication Sig Start Date End Date Taking? Authorizing Provider  naproxen (NAPROSYN) 500 MG tablet Take 1 tablet (500 mg total) by mouth 2 (two) times daily with a meal. As  needed for pain Patient not taking: Reported on 02/12/2015 12/15/14   Tammy Triplett, PA-C  pantoprazole (PROTONIX) 40 MG tablet 1 PO 30 MINS BEFORE YOUR FIRST MEAL. 02/12/15   West BaliSandi L Fields, MD   Meds Ordered and Administered this Visit  Medications - No data to display  There were no vitals taken for this visit. No data found.   Physical  Exam  Constitutional: She is oriented to person, place, and time. She appears well-developed. No distress.  Cardiovascular: Regular rhythm and normal heart sounds.  Tachycardia present.   No murmur heard. Pulmonary/Chest: Effort normal and breath sounds normal. No respiratory distress. She has no wheezes.  Abdominal: Soft. Bowel sounds are normal. She exhibits no distension and no mass. There is no tenderness.  Neurological: She is alert and oriented to person, place, and time.  Nursing note and vitals reviewed.   ED Course  Procedures (including critical care time)  Labs Review Labs Reviewed - No data to display  Imaging Review No results found.   Visual Acuity Review  Right Eye Distance:   Left Eye Distance:   Bilateral Distance:    Right Eye Near:   Left Eye Near:    Bilateral Near:         MDM  Pharyngitis  Tachycardia  Likely viral infection. Strep test done today was negative. Patient reassured that antibiotic is not needed. Use Tylenol as needed for pain. F/U as needed.  HR elevated, patient asymptomatic. Might be due to pain or anxiety. Rest at home. And follow up with PCP within this week. Return precaution discussed. Patient agreed with plan.  Doreene ElandKehinde T Marina Desire, MD 11/15/15 2000

## 2015-11-15 NOTE — Discharge Instructions (Signed)
It was nice seeing you today. I am sorry about your throat pain. It is likely due to viral infection. Your strep test is negative. Please use Tylenol as needed for pain. Follow up as needed. Sore Throat A sore throat is a painful, burning, sore, or scratchy feeling of the throat. There may be pain or tenderness when swallowing or talking. You may have other symptoms with a sore throat. These include coughing, sneezing, fever, or a swollen neck. A sore throat is often the first sign of another sickness. These sicknesses may include a cold, flu, strep throat, or an infection called mono. Most sore throats go away without medical treatment.  HOME CARE   Only take medicine as told by your doctor.  Drink enough fluids to keep your pee (urine) clear or pale yellow.  Rest as needed.  Try using throat sprays, lozenges, or suck on hard candy (if older than 4 years or as told).  Sip warm liquids, such as broth, herbal tea, or warm water with honey. Try sucking on frozen ice pops or drinking cold liquids.  Rinse the mouth (gargle) with salt water. Mix 1 teaspoon salt with 8 ounces of water.  Do not smoke. Avoid being around others when they are smoking.  Put a humidifier in your bedroom at night to moisten the air. You can also turn on a hot shower and sit in the bathroom for 5-10 minutes. Be sure the bathroom door is closed. GET HELP RIGHT AWAY IF:   You have trouble breathing.  You cannot swallow fluids, soft foods, or your spit (saliva).  You have more puffiness (swelling) in the throat.  Your sore throat does not get better in 7 days.  You feel sick to your stomach (nauseous) and throw up (vomit).  You have a fever or lasting symptoms for more than 2-3 days.  You have a fever and your symptoms suddenly get worse. MAKE SURE YOU:   Understand these instructions.  Will watch your condition.  Will get help right away if you are not doing well or get worse.   This information is not  intended to replace advice given to you by your health care provider. Make sure you discuss any questions you have with your health care provider.   Document Released: 03/29/2008 Document Revised: 03/14/2012 Document Reviewed: 02/26/2012 Elsevier Interactive Patient Education [employer]2016 Elsevier Inc.

## 2015-11-15 NOTE — ED Notes (Signed)
[facility] with a complaint of bilateral ear pain and a sore throat x 4 days.

## 2015-11-18 ENCOUNTER — Telehealth: Payer: Self-pay | Admitting: Family Medicine

## 2015-11-18 LAB — CULTURE, GROUP A STREP (THRC)

## 2015-11-18 MED ORDER — PENICILLIN V POTASSIUM 500 MG PO TABS: 500.0000 mg | ORAL_TABLET | Freq: Two times a day (BID) | ORAL | Status: DC

## 2015-11-18 NOTE — Telephone Encounter (Signed)
Patient contacted about strep culture report which shows few strep. She stated she is still having some symptoms. She also confessed that she had took some old prescribed amoxicillin which helped with her symptoms. I worry that this must have been the reason for her initial neg rapid strep and few strep on culture. I will go ahead and prescribed penicillin for complete eradication of the organism. I e-scribed prescription to the pharmacy of her choice.

## 2016-12-27 ENCOUNTER — Emergency Department (HOSPITAL_COMMUNITY): Payer: BLUE CROSS/BLUE SHIELD

## 2016-12-27 ENCOUNTER — Encounter (HOSPITAL_COMMUNITY): Payer: Self-pay | Admitting: Emergency Medicine

## 2016-12-27 ENCOUNTER — Emergency Department (HOSPITAL_COMMUNITY)
Admission: EM | Admit: 2016-12-27 | Discharge: 2016-12-27 | Disposition: A | Discharge status: Home / Self Care | Payer: BLUE CROSS/BLUE SHIELD | Attending: Emergency Medicine | Admitting: Emergency Medicine

## 2016-12-27 DIAGNOSIS — K439 Ventral hernia without obstruction or gangrene: Secondary | ICD-10-CM | POA: Diagnosis not present

## 2016-12-27 DIAGNOSIS — R1033 Periumbilical pain: Secondary | ICD-10-CM | POA: Diagnosis present

## 2016-12-27 MED ORDER — PANTOPRAZOLE SODIUM 40 MG PO TBEC: 40.0000 mg | DELAYED_RELEASE_TABLET | Freq: Every day | ORAL | 0 refills | Status: DC

## 2016-12-27 NOTE — Discharge Instructions (Signed)
I spoke with the general surgeon on-call. Please call his office for an appointment for hernia repair. Return if worse

## 2016-12-27 NOTE — ED Notes (Signed)
ED Provider at bedside. 

## 2016-12-27 NOTE — ED Provider Notes (Signed)
[facility] Provider Note   CSN: [id number] Arrival date & time: 12/27/16  1146     History   Chief Complaint Chief Complaint  Patient presents with  . Abdominal Pain    HPI Darlene Vazquez is a 31 y.o. female.  Patient complains of periumbilical pain for 2 days. She is status post tubal ligation. No fever, sweats, chills, nausea, vomiting, diarrhea, chest pain, dyspnea, dysuria.  Severity of symptoms mild. Nothing makes it better or worse. She is normally healthy. She has a known ventral hernia superior to her umbilicus.      Past Medical History:  Diagnosis Date  . GERD (gastroesophageal reflux disease)     Patient Active Problem List   Diagnosis Date Noted  . Dyspepsia   . Abdominal pain, epigastric 08/06/2014  . Ruptured ectopic pregnancy 11/09/2013    Past Surgical History:  Procedure Laterality Date  . DIAGNOSTIC LAPAROSCOPY WITH REMOVAL OF ECTOPIC PREGNANCY Right 11/09/2013   Procedure: DIAGNOSTIC LAPAROSCOPY WITH REMOVAL OF ECTOPIC PREGNANCY;  Surgeon: Lazaro ArmsLuther H Eure, MD;  Location: [facility];  Service: Gynecology;  Laterality: Right;  . ESOPHAGOGASTRODUODENOSCOPY N/A 08/12/2014   SLF: 1. No obvious source for epigastric pain identified. 2. Mild gastiritis.   Marland Kitchen. LAPAROSCOPY N/A 11/09/2013   Procedure: LAPAROSCOPY OPERATIVE;  Surgeon: Lazaro ArmsLuther H Eure, MD;  Location: [facility];  Service: Gynecology;  Laterality: N/A;  . UNILATERAL SALPINGECTOMY Right 11/09/2013   Procedure: UNILATERAL SALPINGECTOMY;  Surgeon: Lazaro ArmsLuther H Eure, MD;  Location: [facility];  Service: Gynecology;  Laterality: Right;    OB History    Gravida Para Term Preterm AB Living   1             SAB TAB Ectopic Multiple Live Births                   Home Medications    Prior to Admission medications   Medication Sig Start Date End Date Taking? Authorizing Provider  pantoprazole (PROTONIX) 40 MG tablet 1 PO 30 MINS BEFORE YOUR FIRST MEAL. 02/12/15  Yes Fields, Darleene CleaverSandi L, MD  naproxen (NAPROSYN) 500 MG  tablet Take 1 tablet (500 mg total) by mouth 2 (two) times daily with a meal. As needed for pain Patient not taking: Reported on 02/12/2015 12/15/14   Triplett, Tammy, PA-C  penicillin v potassium (VEETID) 500 MG tablet Take 1 tablet (500 mg total) by mouth 2 (two) times daily. Patient not taking: Reported on 12/27/2016 11/18/15   Doreene ElandEniola, Kehinde T, MD    Family History Family History  Problem Relation Age of Onset  . Wilson's disease Maternal Grandfather   . Colon cancer Neg Hx   . Colon polyps Neg Hx     Social History Social History  Substance Use Topics  . Smoking status: Never Smoker  . Smokeless tobacco: Never Used     Comment: Never smoked  . Alcohol use No     Allergies   Patient has no known allergies.   Review of Systems Review of Systems  All other systems reviewed and are negative.    Physical Exam Updated Vital Signs BP 129/84   Pulse 92   Temp 98.1 F (36.7 C) (Oral)   Resp 18   Ht 5' (1.524 m)   Wt 77.6 kg (171 lb)   LMP 12/06/2016   SpO2 100%   BMI 33.40 kg/m   Physical Exam  Constitutional: She is oriented to person, place, and time. She appears well-developed and well-nourished.  HENT:  Head:  Normocephalic and atraumatic.  Eyes: Conjunctivae are normal.  Neck: Neck supple.  Cardiovascular: Normal rate and regular rhythm.   Pulmonary/Chest: Effort normal and breath sounds normal.  Abdominal:  Minimal periumbilical tenderness.  Small supraumbilical hernia  Musculoskeletal: Normal range of motion.  Neurological: She is alert and oriented to person, place, and time.  Skin: Skin is warm and dry.  Psychiatric: She has a normal mood and affect. Her behavior is normal.  Nursing note and vitals reviewed.    ED Treatments / Results  Labs (all labs ordered are listed, but only abnormal results are displayed) Labs Reviewed - No data to display  EKG  EKG Interpretation None       Radiology Dg Abdomen Acute W/chest  Result Date:  12/27/2016 CLINICAL DATA:  History of ventral hernia, abdominal pain and nausea EXAM: DG ABDOMEN ACUTE W/ 1V CHEST COMPARISON:  None. FINDINGS: No active infiltrate or effusion is seen. Mediastinal and hilar contours are unremarkable. The heart is within normal limits in size. Supine and erect views of the abdomen show a moderate amount of feces throughout the colon. No bowel obstruction is seen. No opaque calculi are noted. No free air is seen on the erect view. Surgical clips are present in the lower right quadrant and left pelvis. IMPRESSION: 1. No active lung disease. 2. Moderate amount of feces in the colon. No bowel obstruction or free air. Electronically Signed   By: Dwyane Dee M.D.   On: 12/27/2016 13:28    Procedures Procedures (including critical care time)  Medications Ordered in ED Medications - No data to display   Initial Impression / Assessment and Plan / ED Course  I have reviewed the triage vital signs and the nursing notes.  Pertinent labs & imaging results that were available during my care of the patient were reviewed by me and considered in my medical decision making (see chart for details).     Patient is a known ventral hernia. Acute abdominal series shows no obstruction. Will refer to general surgery.  Final Clinical Impressions(s) / ED Diagnoses   Final diagnoses:  Ventral hernia without obstruction or gangrene    New Prescriptions New Prescriptions   No medications on file     Donnetta Hutching, MD 01/02/17 1437

## 2017-01-31 ENCOUNTER — Ambulatory Visit: Payer: BLUE CROSS/BLUE SHIELD | Admitting: General Surgery

## 2017-02-28 ENCOUNTER — Ambulatory Visit: Payer: BLUE CROSS/BLUE SHIELD | Admitting: General Surgery

## 2017-05-25 ENCOUNTER — Encounter (HOSPITAL_COMMUNITY): Payer: Self-pay

## 2017-05-25 ENCOUNTER — Emergency Department (HOSPITAL_COMMUNITY)
Admission: EM | Admit: 2017-05-25 | Discharge: 2017-05-25 | Disposition: A | Discharge status: Home / Self Care | Payer: BLUE CROSS/BLUE SHIELD | Attending: Emergency Medicine | Admitting: Emergency Medicine

## 2017-05-25 DIAGNOSIS — R21 Rash and other nonspecific skin eruption: Secondary | ICD-10-CM | POA: Diagnosis present

## 2017-05-25 DIAGNOSIS — Z79899 Other long term (current) drug therapy: Secondary | ICD-10-CM | POA: Insufficient documentation

## 2017-05-25 DIAGNOSIS — L245 Irritant contact dermatitis due to other chemical products: Secondary | ICD-10-CM | POA: Insufficient documentation

## 2017-05-25 MED ORDER — HYDROXYZINE HCL 25 MG PO TABS: 25.0000 mg | ORAL_TABLET | Freq: Four times a day (QID) | ORAL | 0 refills | Status: DC

## 2017-05-25 MED ORDER — DEXAMETHASONE SODIUM PHOSPHATE 10 MG/ML IJ SOLN
10.0000 mg | Freq: Once | INTRAMUSCULAR | Status: AC
  Administered 2017-05-25: 10 mg via INTRAMUSCULAR
  Filled 2017-05-25: qty 1

## 2017-05-25 MED ORDER — PREDNISONE 10 MG PO TABS: ORAL_TABLET | ORAL | 0 refills | Status: DC

## 2017-05-25 MED ORDER — HYDROXYZINE HCL 25 MG PO TABS
50.0000 mg | ORAL_TABLET | Freq: Once | ORAL | Status: AC
  Administered 2017-05-25: 50 mg via ORAL
  Filled 2017-05-25: qty 2

## 2017-05-25 NOTE — ED Triage Notes (Signed)
Pt reports allergic reaction to hair color.  Reports used the hair color Saturday and broke out in a rash all along her hairline.  Also reports has a knot on the back of her head.  Reports the knot is tender.

## 2017-05-25 NOTE — Discharge Instructions (Signed)
Start taking your prednisone tomorrow as you have received this medicine in shot form today. Try atarax in place of benadryl which may help your itching better.  Cool compresses/ice packs can also help with itching as can Gold Bond anti itch cream (along forehead and neck/ears).

## 2017-05-25 NOTE — ED Provider Notes (Signed)
[facility] EMERGENCY DEPARTMENT Provider Note   CSN: [id number] Arrival date & time: 05/25/17  1011     History   Chief Complaint Chief Complaint  Patient presents with  . Rash    HPI Darlene Vazquez is a 31 y.o. female.  The history is provided by the patient.  Rash   This is a new problem. Episode onset: Pt colored her hair 5 days ago, and the following day woke with rash. The problem has been gradually worsening. The problem is associated with chemical exposure. There has been no fever. The rash is present on the face, scalp and neck. The pain is at a severity of 0/10. The patient is experiencing no pain. The pain has been constant since onset. Associated symptoms include itching. She has tried antihistamines (benadryl) for the symptoms. The treatment provided no relief.    Past Medical History:  Diagnosis Date  . GERD (gastroesophageal reflux disease)     Patient Active Problem List   Diagnosis Date Noted  . Dyspepsia   . Abdominal pain, epigastric 08/06/2014  . Ruptured ectopic pregnancy 11/09/2013    Past Surgical History:  Procedure Laterality Date  . DIAGNOSTIC LAPAROSCOPY WITH REMOVAL OF ECTOPIC PREGNANCY Right 11/09/2013   Procedure: DIAGNOSTIC LAPAROSCOPY WITH REMOVAL OF ECTOPIC PREGNANCY;  Surgeon: Lazaro ArmsLuther H Eure, MD;  Location: [facility];  Service: Gynecology;  Laterality: Right;  . ESOPHAGOGASTRODUODENOSCOPY N/A 08/12/2014   SLF: 1. No obvious source for epigastric pain identified. 2. Mild gastiritis.   Marland Kitchen. LAPAROSCOPY N/A 11/09/2013   Procedure: LAPAROSCOPY OPERATIVE;  Surgeon: Lazaro ArmsLuther H Eure, MD;  Location: [facility];  Service: Gynecology;  Laterality: N/A;  . UNILATERAL SALPINGECTOMY Right 11/09/2013   Procedure: UNILATERAL SALPINGECTOMY;  Surgeon: Lazaro ArmsLuther H Eure, MD;  Location: [facility];  Service: Gynecology;  Laterality: Right;    OB History    Gravida Para Term Preterm AB Living   1             SAB TAB Ectopic Multiple Live Births                   Home  Medications    Prior to Admission medications   Medication Sig Start Date End Date Taking? Authorizing Provider  pantoprazole (PROTONIX) 40 MG tablet Take 1 tablet (40 mg total) by mouth daily. 12/27/16  Yes Donnetta Hutchingook, Brian, MD  hydrOXYzine (ATARAX/VISTARIL) 25 MG tablet Take 1-2 tablets (25-50 mg total) by mouth every 6 (six) hours. 05/25/17   Burgess AmorIdol, Adina Puzzo, PA-C  predniSONE (DELTASONE) 10 MG tablet Take 6 tablets day one, 5 tablets day two, 4 tablets day three, 3 tablets day four, 2 tablets day five, then 1 tablet day six 05/25/17   Burgess AmorIdol, Jamall Strohmeier, PA-C    Family History Family History  Problem Relation Age of Onset  . Wilson's disease Maternal Grandfather   . Colon cancer Neg Hx   . Colon polyps Neg Hx     Social History Social History   Tobacco Use  . Smoking status: Never Smoker  . Smokeless tobacco: Never Used  . Tobacco comment: Never smoked  Substance Use Topics  . Alcohol use: No    Alcohol/week: 0.0 oz  . Drug use: No     Allergies   Other   Review of Systems Review of Systems  Constitutional: Negative for chills and fever.  Respiratory: Negative for shortness of breath and wheezing.   Skin: Positive for itching and rash.  Neurological: Negative for numbness.     Physical  Exam Updated Vital Signs BP 133/87   Pulse 84   Temp 98 F (36.7 C) (Oral)   Resp 18   LMP 05/11/2017   SpO2 100%   Physical Exam  Constitutional: She appears well-developed and well-nourished. No distress.  HENT:  Head: Normocephalic.  Neck: Neck supple.  Cardiovascular: Normal rate.  Pulmonary/Chest: Effort normal. She has no wheezes.  Musculoskeletal: Normal range of motion. She exhibits no edema.  Lymphadenopathy:       Head (right side): Preauricular and occipital adenopathy present.       Head (left side): Preauricular adenopathy present.  Skin: Rash noted. Rash is papular.  Rash noted scalp, along hairline, neck and behind ears.     ED Treatments / Results  Labs (all  labs ordered are listed, but only abnormal results are displayed) Labs Reviewed - No data to display  EKG  EKG Interpretation None       Radiology No results found.  Procedures Procedures (including critical care time)  Medications Ordered in ED Medications  dexamethasone (DECADRON) injection 10 mg (10 mg Intramuscular Given 05/25/17 1115)  hydrOXYzine (ATARAX/VISTARIL) tablet 50 mg (50 mg Oral Given 05/25/17 1115)     Initial Impression / Assessment and Plan / ED Course  I have reviewed the triage vital signs and the nursing notes.  Pertinent labs & imaging results that were available during my care of the patient were reviewed by me and considered in my medical decision making (see chart for details).     Pt with contact dermatitis from hair color.  Tx with steroids, antihistamines, discussed cool compresses, anti itch cream. Prn f/u anticipated.  Final Clinical Impressions(s) / ED Diagnoses   Final diagnoses:  Irritant contact dermatitis due to other chemical products    ED Discharge Orders        Ordered    predniSONE (DELTASONE) 10 MG tablet     05/25/17 1114    hydrOXYzine (ATARAX/VISTARIL) 25 MG tablet  Every 6 hours     05/25/17 1114       Burgess Amordol, Hilario Robarts, Cordelia Poche-C 05/25/17 1124    Raeford RazorKohut, Stephen, MD 05/25/17 1245

## 2017-05-25 NOTE — ED Notes (Signed)
ED Provider at bedside. 

## 2018-04-12 ENCOUNTER — Ambulatory Visit: Payer: Medicaid Other | Admitting: Gastroenterology

## 2018-04-12 ENCOUNTER — Encounter: Payer: Self-pay | Admitting: Gastroenterology

## 2018-04-12 DIAGNOSIS — R1033 Periumbilical pain: Secondary | ICD-10-CM | POA: Diagnosis not present

## 2018-04-12 DIAGNOSIS — K219 Gastro-esophageal reflux disease without esophagitis: Secondary | ICD-10-CM

## 2018-04-12 DIAGNOSIS — K5901 Slow transit constipation: Secondary | ICD-10-CM

## 2018-04-12 DIAGNOSIS — K5900 Constipation, unspecified: Secondary | ICD-10-CM | POA: Insufficient documentation

## 2018-04-12 NOTE — Patient Instructions (Signed)
To treat CONSTIPATION:   1. DRINK WATER TO KEEP YOUR URINE LIGHT YELLOW.    2. FOLLOW A HIGH FIBER DIET. AVOID ITEMS THAT CAUSE BLOATING & GAS. SEE INFO BELOW.    3. USE EITHER LINZESS WITH YOUR FIRST MEAL OR MOTEGRITY.  PLEASE CALL IN 2 WEEKS TO LET ME KNOW IF YOU HAVE ANY CONCERNS AND WHICH ONE YOU PREFER.   PLEASE CALL SOONER IF YOU HAVE ANY PROBLEMS WITH THE MEDICATION.  IF YOU LOSE 20 LBS THE UMBILICAL HERNIA WILL BE LESS PAINFUL.  FOLLOW UP IN 4 MOS.   High-Fiber Diet A high-fiber diet changes your normal diet to include more whole grains, legumes, fruits, and vegetables. Changes in the diet involve replacing refined carbohydrates with unrefined foods. The calorie level of the diet is essentially unchanged. The Dietary Reference Intake (recommended amount) for adult males is 38 grams per day. For adult females, it is 25 grams per day. Pregnant and lactating women should consume 28 grams of fiber per day.Fiber is the intact part of a plant that is not broken down during digestion. Functional fiber is fiber that has been isolated from the plant to provide a beneficial effect in the body.  PURPOSE  Increase stool bulk.   Ease and regulate bowel movements.   Lower cholesterol.   REDUCE RISK OF COLON CANCER  INDICATIONS THAT YOU NEED MORE FIBER  Constipation and hemorrhoids.   Uncomplicated diverticulosis (intestine condition) and irritable bowel syndrome.   Weight management.   As a protective measure against hardening of the arteries (atherosclerosis), diabetes, and cancer.   GUIDELINES FOR INCREASING FIBER IN THE DIET  Start adding fiber to the diet slowly. A gradual increase of about 5 more grams (2 slices of whole-wheat bread, 2 servings of most fruits or vegetables, or 1 bowl of high-fiber cereal) per day is best. Too rapid an increase in fiber may result in constipation, flatulence, and bloating.   Drink enough water and fluids to keep your urine clear or pale yellow.  Water, juice, or caffeine-free drinks are recommended. Not drinking enough fluid may cause constipation.   Eat a variety of high-fiber foods rather than one type of fiber.   Try to increase your intake of fiber through using high-fiber foods rather than fiber pills or supplements that contain small amounts of fiber.   The goal is to change the types of food eaten. Do not supplement your present diet with high-fiber foods, but replace foods in your present diet.  INCLUDE A VARIETY OF FIBER SOURCES  Replace refined and processed grains with whole grains, canned fruits with fresh fruits, and incorporate other fiber sources. White rice, white breads, and most bakery goods contain little or no fiber.   Brown whole-grain rice, buckwheat oats, and many fruits and vegetables are all good sources of fiber. These include: broccoli, Brussels sprouts, cabbage, cauliflower, beets, sweet potatoes, white potatoes (skin on), carrots, tomatoes, eggplant, squash, berries, fresh fruits, and dried fruits.   Cereals appear to be the richest source of fiber. Cereal fiber is found in whole grains and bran. Bran is the fiber-rich outer coat of cereal grain, which is largely removed in refining. In whole-grain cereals, the bran remains. In breakfast cereals, the largest amount of fiber is found in those with "bran" in their names. The fiber content is sometimes indicated on the label.   You may need to include additional fruits and vegetables each day.   In baking, for 1 cup white flour, you may use  the following substitutions:   1 cup whole-wheat flour minus 2 tablespoons.   1/2 cup white flour plus 1/2 cup whole-wheat flour.

## 2018-04-12 NOTE — Progress Notes (Signed)
cc'ed to pcp °

## 2018-04-12 NOTE — Assessment & Plan Note (Signed)
INTERMITTENT AND MORE NOTICEABLE SINCE WEIGHT GAIN.   IF YOU LOSE 20 LBS THE UMBILICAL HERNIA WILL BE LESS PAINFUL. CONTINUE TO MONITOR SYMPTOMS. REFER TO SURGERY IF NEEDED.

## 2018-04-12 NOTE — Assessment & Plan Note (Signed)
SYMPTOMS CONTROLLED/RESOLVED ON NO MEDS.  CONTINUE TO MONITOR SYMPTOMS. 

## 2018-04-12 NOTE — Progress Notes (Signed)
   Subjective:    Patient ID: Darlene Vazquez, female    DOB: Dec 09, 1985, 32 y.o.   MRN: [id number]  Royann Shivers, PA-C   HPI Wants something for abdominal pain/constipation. BMs: q2-3 days. MAY BE ASSOCIATED WITH LOW BACK PAIN. USUALLY IN RIGHT FLANK AND LOWER BACK(ACHY), BETTER AFTER BMs. OCCASIONAL HEARTBURN: 1-2X/MO OR LESS. MEDS TRIED FOR CONSTIPATION: MIRALAX/FIBER GUMMIES MAKES HER STOMACH HURT, MAKES PAIN @ [facility]. SOMETIMES UMBILICAL HERNIA(TENDER TO TOUCH,DIME SIZE).  PT DENIES FEVER, CHILLS, HEMATOCHEZIA, HEMATEMESIS, nausea, vomiting, melena, diarrhea, CHEST PAIN, SHORTNESS OF BREATH,  CHANGE IN BOWEL IN HABITS, OR problems swallowing.  Past Medical History:  Diagnosis Date  . GERD (gastroesophageal reflux disease)    Past Surgical History:  Procedure Laterality Date  . DIAGNOSTIC LAPAROSCOPY WITH REMOVAL OF ECTOPIC PREGNANCY Right 11/09/2013   Procedure: DIAGNOSTIC LAPAROSCOPY WITH REMOVAL OF ECTOPIC PREGNANCY;  Surgeon: Lazaro Arms, MD;  Location: [facility];  Service: Gynecology;  Laterality: Right;  . ESOPHAGOGASTRODUODENOSCOPY N/A 08/12/2014   SLF: 1. No obvious source for epigastric pain identified. 2. Mild gastiritis.   Marland Kitchen LAPAROSCOPY N/A 11/09/2013   Procedure: LAPAROSCOPY OPERATIVE;  Surgeon: Lazaro Arms, MD;  Location: [facility];  Service: Gynecology;  Laterality: N/A;  . UNILATERAL SALPINGECTOMY Right 11/09/2013   Procedure: UNILATERAL SALPINGECTOMY;  Surgeon: Lazaro Arms, MD;  Location: [facility];  Service: Gynecology;  Laterality: Right;   Allergies  Allergen Reactions  . Other     Hair dye   Current Outpatient Medications  Medication Sig Dispense Refill  .      .      .       Review of Systems PER HPI OTHERWISE ALL SYSTEMS ARE NEGATIVE.    Objective:   Physical Exam  Constitutional: She is oriented to person, place, and time. She appears well-developed and well-nourished. No distress.  HENT:  Head: Normocephalic and atraumatic.  Mouth/Throat: Oropharynx  is clear and moist. No oropharyngeal exudate.  Eyes: Pupils are equal, round, and reactive to light. No scleral icterus.  Neck: Normal range of motion. Neck supple.  Cardiovascular: Normal rate, regular rhythm and normal heart sounds.  Pulmonary/Chest: Effort normal and breath sounds normal. No respiratory distress.  Abdominal: Soft. Bowel sounds are normal. She exhibits no distension. There is no tenderness.  Musculoskeletal: She exhibits no edema.  Lymphadenopathy:    She has no cervical adenopathy.  Neurological: She is alert and oriented to person, place, and time.  NO  NEW FOCAL DEFICITS  Psychiatric: She has a normal mood and affect.  Vitals reviewed.     Assessment & Plan:

## 2018-04-12 NOTE — Assessment & Plan Note (Addendum)
SYMPTOMS NOT IDEALLY CONTROLLED.  To treat CONSTIPATION:   1. DRINK WATER TO KEEP YOUR URINE LIGHT YELLOW.    2. FOLLOW A HIGH FIBER DIET. AVOID ITEMS THAT CAUSE BLOATING & GAS.  HANDOUT GIVEN.    3. USE EITHER LINZESS WITH YOUR FIRST MEAL OR MOTEGRITY.  PLEASE CALL IN 2 WEEKS TO LET ME KNOW IF YOU HAVE ANY CONCERNS AND WHICH ONE YOU PREFER.  PLEASE CALL SOONER IF YOU HAVE ANY PROBLEMS WITH THE MEDICATION. FOLLOW UP IN 4 MOS.

## 2018-04-13 NOTE — Progress Notes (Signed)
ON RECALL  °

## 2018-06-26 ENCOUNTER — Encounter: Payer: Self-pay | Admitting: Gastroenterology

## 2018-07-19 ENCOUNTER — Encounter (HOSPITAL_COMMUNITY): Payer: Self-pay | Admitting: Emergency Medicine

## 2018-07-19 ENCOUNTER — Emergency Department (HOSPITAL_COMMUNITY)
Admission: EM | Admit: 2018-07-19 | Discharge: 2018-07-20 | Disposition: A | Discharge status: Home / Self Care | Payer: Medicaid Other | Attending: Emergency Medicine | Admitting: Emergency Medicine

## 2018-07-19 ENCOUNTER — Other Ambulatory Visit: Payer: Self-pay

## 2018-07-19 DIAGNOSIS — J101 Influenza due to other identified influenza virus with other respiratory manifestations: Secondary | ICD-10-CM | POA: Diagnosis not present

## 2018-07-19 DIAGNOSIS — R509 Fever, unspecified: Secondary | ICD-10-CM | POA: Diagnosis present

## 2018-07-19 NOTE — ED Triage Notes (Signed)
Pt c/o headache and body aches. Nad. Here with son having same sxs

## 2018-07-20 LAB — INFLUENZA PANEL BY PCR (TYPE A & B)
INFLAPCR: NEGATIVE
INFLBPCR: POSITIVE — AB

## 2018-07-20 MED ORDER — OSELTAMIVIR PHOSPHATE 75 MG PO CAPS
75.0000 mg | ORAL_CAPSULE | Freq: Once | ORAL | Status: AC
  Administered 2018-07-20: 75 mg via ORAL
  Filled 2018-07-20: qty 1

## 2018-07-20 MED ORDER — HYDROCOD POLST-CPM POLST ER 10-8 MG/5ML PO SUER
5.0000 mL | Freq: Once | ORAL | Status: AC
Start: 2018-07-20 — End: 2018-07-20
  Administered 2018-07-20: 5 mL via ORAL
  Filled 2018-07-20: qty 5

## 2018-07-20 MED ORDER — OSELTAMIVIR PHOSPHATE 75 MG PO CAPS: 75.0000 mg | ORAL_CAPSULE | Freq: Two times a day (BID) | ORAL | 0 refills | Status: DC

## 2018-07-20 MED ORDER — HYDROCOD POLST-CPM POLST ER 10-8 MG/5ML PO SUER: 5.0000 mL | Freq: Two times a day (BID) | ORAL | 0 refills | Status: DC | PRN

## 2018-07-20 NOTE — ED Provider Notes (Signed)
[facility] EMERGENCY DEPARTMENT Provider Note   CSN: [id number] Arrival date & time: 07/19/18  2336     History   Chief Complaint Chief Complaint  Patient presents with  . Generalized Body Aches  . Headache    HPI Darlene Vazquez is a 33 y.o. female who presents to the ED with flu like symptoms that started yesterday. Patient reports fever, chills, body aches and scratchy throat.   The history is provided by the patient. No language interpreter was used.  Influenza  Presenting symptoms: cough, fatigue, fever, headache, myalgias and sore throat   Presenting symptoms: no nausea, no shortness of breath and no vomiting   Severity:  Moderate Onset quality:  Gradual Duration:  1 day Progression:  Worsening Chronicity:  New Relieved by:  Nothing Worsened by:  Nothing Ineffective treatments:  OTC medications, hot fluids and decongestant Associated symptoms: chills, decreased appetite, decreased physical activity and nasal congestion   Associated symptoms: no ear pain, no neck stiffness and no syncope   Risk factors: sick contacts     Past Medical History:  Diagnosis Date  . GERD (gastroesophageal reflux disease)     Patient Active Problem List   Diagnosis Date Noted  . Constipation 04/12/2018  . GERD (gastroesophageal reflux disease)   . Abdominal pain, periumbilical 08/06/2014  . Ruptured ectopic pregnancy 11/09/2013    Past Surgical History:  Procedure Laterality Date  . DIAGNOSTIC LAPAROSCOPY WITH REMOVAL OF ECTOPIC PREGNANCY Right 11/09/2013   Procedure: DIAGNOSTIC LAPAROSCOPY WITH REMOVAL OF ECTOPIC PREGNANCY;  Surgeon: Lazaro Arms, MD;  Location: [facility];  Service: Gynecology;  Laterality: Right;  . ESOPHAGOGASTRODUODENOSCOPY N/A 08/12/2014   SLF: 1. No obvious source for epigastric pain identified. 2. Mild gastiritis.   Marland Kitchen LAPAROSCOPY N/A 11/09/2013   Procedure: LAPAROSCOPY OPERATIVE;  Surgeon: Lazaro Arms, MD;  Location: [facility];  Service: Gynecology;  Laterality:  N/A;  . UNILATERAL SALPINGECTOMY Right 11/09/2013   Procedure: UNILATERAL SALPINGECTOMY;  Surgeon: Lazaro Arms, MD;  Location: [facility];  Service: Gynecology;  Laterality: Right;     OB History    Gravida  1   Para      Term      Preterm      AB      Living        SAB      TAB      Ectopic      Multiple      Live Births               Home Medications    Prior to Admission medications   Medication Sig Start Date End Date Taking? Authorizing Provider  chlorpheniramine-HYDROcodone (TUSSIONEX PENNKINETIC ER) 10-8 MG/5ML SUER Take 5 mLs by mouth every 12 (twelve) hours as needed for cough. 07/20/18   Janne Napoleon, NP  hydrOXYzine (ATARAX/VISTARIL) 25 MG tablet Take 1-2 tablets (25-50 mg total) by mouth every 6 (six) hours. Patient not taking: Reported on 04/12/2018 05/25/17   Burgess Amor, PA-C  oseltamivir (TAMIFLU) 75 MG capsule Take 1 capsule (75 mg total) by mouth every 12 (twelve) hours. 07/20/18   Janne Napoleon, NP  pantoprazole (PROTONIX) 40 MG tablet Take 1 tablet (40 mg total) by mouth daily. Patient not taking: Reported on 04/12/2018 12/27/16   Donnetta Hutching, MD  predniSONE (DELTASONE) 10 MG tablet Take 6 tablets day one, 5 tablets day two, 4 tablets day three, 3 tablets day four, 2 tablets day five, then 1 tablet day six  Patient not taking: Reported on 04/12/2018 05/25/17   Burgess Amor, PA-C    Family History Family History  Problem Relation Age of Onset  . Wilson's disease Maternal Grandfather   . Colon cancer Neg Hx   . Colon polyps Neg Hx     Social History Social History   Tobacco Use  . Smoking status: Never Smoker  . Smokeless tobacco: Never Used  . Tobacco comment: Never smoked  Substance Use Topics  . Alcohol use: No    Alcohol/week: 0.0 standard drinks  . Drug use: No     Allergies   Other   Review of Systems Review of Systems  Constitutional: Positive for chills, decreased appetite, fatigue and fever.  HENT: Positive for  congestion and sore throat. Negative for ear pain.   Eyes: Positive for redness.  Respiratory: Positive for cough. Negative for shortness of breath.   Gastrointestinal: Negative for nausea and vomiting.  Genitourinary: Negative for dysuria, frequency and urgency.  Musculoskeletal: Positive for myalgias. Negative for neck stiffness.  Skin: Negative for rash.  Neurological: Positive for headaches.  Psychiatric/Behavioral: Negative for confusion.     Physical Exam Updated Vital Signs BP (!) 154/107   Pulse (!) 101   Temp 98.8 F (37.1 C) (Oral)   Resp 18   LMP 07/17/2018   SpO2 98%   Physical Exam Vitals signs and nursing note reviewed.  Constitutional:      Appearance: She is well-developed.  HENT:     Head: Normocephalic.     Right Ear: Tympanic membrane normal.     Left Ear: Tympanic membrane normal.     Nose: Congestion present.     Mouth/Throat:     Mouth: Mucous membranes are moist.  Eyes:     Extraocular Movements: Extraocular movements intact.     Conjunctiva/sclera: Conjunctivae normal.  Neck:     Musculoskeletal: Neck supple. No neck rigidity.  Cardiovascular:     Rate and Rhythm: Regular rhythm. Tachycardia present.  Pulmonary:     Effort: Pulmonary effort is normal.     Breath sounds: No wheezing or rales.  Abdominal:     Palpations: Abdomen is soft.     Tenderness: There is no abdominal tenderness.  Musculoskeletal: Normal range of motion.  Lymphadenopathy:     Cervical: No cervical adenopathy.  Skin:    General: Skin is warm and dry.  Neurological:     Mental Status: She is alert and oriented to person, place, and time.  Psychiatric:        Mood and Affect: Mood normal.      ED Treatments / Results  Labs (all labs ordered are listed, but only abnormal results are displayed) Labs Reviewed  INFLUENZA PANEL BY PCR (TYPE A & B) - Abnormal; Notable for the following components:      Result Value   Influenza B By PCR POSITIVE (*)    All other  components within normal limits    Radiology No results found.  Procedures Procedures (including critical care time)  Medications Ordered in ED Medications  oseltamivir (TAMIFLU) capsule 75 mg (has no administration in time range)  chlorpheniramine-HYDROcodone (TUSSIONEX) 10-8 MG/5ML suspension 5 mL (5 mLs Oral Given 07/20/18 0101)     Initial Impression / Assessment and Plan / ED Course  I have reviewed the triage vital signs and the nursing notes. SUBJECTIVE:  JANEIL BATTEN is a 33 y.o. female who present complaining of flu-like symptoms: fevers, chills, myalgias, congestion, sore throat and cough for  1 day. Denies dyspnea or wheezing.  OBJECTIVE: Appears moderately ill but not toxic; temperature as noted in vitals. Ears normal. Throat and pharynx normal.  Neck supple. No adenopathy in the neck. Sinuses non tender. The chest is clear.  ASSESSMENT: Influenza B  PLAN: Symptomatic therapy suggested: rest, increase fluids, gargle prn for sore throat, use mist of vaporizer prn and return if symptoms persist or worsen. Tamiflu Rx since symptoms onset less than 48 hours  Final Clinical Impressions(s) / ED Diagnoses   Final diagnoses:  Influenza B    ED Discharge Orders         Ordered    oseltamivir (TAMIFLU) 75 MG capsule  Every 12 hours     07/20/18 0154    chlorpheniramine-HYDROcodone (TUSSIONEX PENNKINETIC ER) 10-8 MG/5ML SUER  Every 12 hours PRN     07/20/18 0157           Damian Leavelleese, AuroraHope M, NP 07/20/18 0200    Devoria AlbeKnapp, Iva, MD 07/20/18 [phone]

## 2018-09-12 ENCOUNTER — Emergency Department (HOSPITAL_COMMUNITY)
Admission: EM | Admit: 2018-09-12 | Discharge: 2018-09-12 | Disposition: A | Discharge status: Home / Self Care | Payer: Medicaid Other | Attending: Emergency Medicine | Admitting: Emergency Medicine

## 2018-09-12 ENCOUNTER — Emergency Department (HOSPITAL_COMMUNITY): Payer: Medicaid Other

## 2018-09-12 ENCOUNTER — Encounter (HOSPITAL_COMMUNITY): Payer: Self-pay | Admitting: Emergency Medicine

## 2018-09-12 ENCOUNTER — Other Ambulatory Visit: Payer: Self-pay

## 2018-09-12 ENCOUNTER — Telehealth: Payer: Self-pay

## 2018-09-12 DIAGNOSIS — R52 Pain, unspecified: Secondary | ICD-10-CM

## 2018-09-12 DIAGNOSIS — R1031 Right lower quadrant pain: Secondary | ICD-10-CM | POA: Diagnosis present

## 2018-09-12 DIAGNOSIS — N83202 Unspecified ovarian cyst, left side: Secondary | ICD-10-CM | POA: Insufficient documentation

## 2018-09-12 DIAGNOSIS — N83209 Unspecified ovarian cyst, unspecified side: Secondary | ICD-10-CM

## 2018-09-12 DIAGNOSIS — R102 Pelvic and perineal pain: Secondary | ICD-10-CM

## 2018-09-12 LAB — URINALYSIS, ROUTINE W REFLEX MICROSCOPIC
Bacteria, UA: NONE SEEN
Bilirubin Urine: NEGATIVE
Glucose, UA: NEGATIVE mg/dL
Ketones, ur: NEGATIVE mg/dL
Leukocytes,Ua: NEGATIVE
Nitrite: NEGATIVE
Protein, ur: NEGATIVE mg/dL
Specific Gravity, Urine: 1.025 (ref 1.005–1.030)
pH: 5 (ref 5.0–8.0)

## 2018-09-12 LAB — PREGNANCY, URINE: Preg Test, Ur: NEGATIVE

## 2018-09-12 MED ORDER — HYDROCODONE-ACETAMINOPHEN 5-325 MG PO TABS: 1.0000 | ORAL_TABLET | ORAL | 0 refills | Status: DC | PRN

## 2018-09-12 MED ORDER — HYDROCODONE-ACETAMINOPHEN 5-325 MG PO TABS
1.0000 | ORAL_TABLET | Freq: Once | ORAL | Status: AC
  Administered 2018-09-12: 1 via ORAL
  Filled 2018-09-12: qty 1

## 2018-09-12 MED ORDER — PRUCALOPRIDE SUCCINATE 2 MG PO TABS: 2.0000 mg | ORAL_TABLET | Freq: Every day | ORAL | 11 refills | Status: DC

## 2018-09-12 MED ORDER — IBUPROFEN 400 MG PO TABS
600.0000 mg | ORAL_TABLET | Freq: Once | ORAL | Status: AC
  Administered 2018-09-12: 600 mg via ORAL
  Filled 2018-09-12: qty 2

## 2018-09-12 NOTE — Telephone Encounter (Signed)
Pt said the Motegrity helped and she would like a prescription sent to [facility] pharmacy.

## 2018-09-12 NOTE — Telephone Encounter (Signed)
Pt is aware.  

## 2018-09-12 NOTE — Telephone Encounter (Signed)
T/C from [place] at [facility], he needs the RX sent to his store. [place]

## 2018-09-12 NOTE — Telephone Encounter (Signed)
PLEASE CALL PT. RX SENT. SHE SHOULD HOLD IF SHE THINKS SHE'S PREGNANT OR GETS PREGNANT.

## 2018-09-12 NOTE — Telephone Encounter (Signed)
I SENT RX TO [facility].  CALL THE RX IN.

## 2018-09-12 NOTE — Discharge Instructions (Addendum)
You have a large ovarian cyst.  Appears to be a "simple cyst."  This means it has a benign appearance and is unlikely to be cancer or anything else concerning.  That being said, it is still rather large in size.  Radiology is recommending routine repeat imaging in 6 months to assess that again.  This will likely resolve by itself though.  Both of your ovaries have good blood flow to them.  Take prescribed pain medicine as needed if ibuprofen is not sufficient.

## 2018-09-12 NOTE — ED Notes (Signed)
Patient transported to Ultrasound 

## 2018-09-12 NOTE — ED Triage Notes (Signed)
Pt c/o lower back pain and right flank pain x 3 days, pt denies any urinary symptoms and denies injury, pt reports she recently starting new meds for constipation, pt reports last BM yesterday

## 2018-09-12 NOTE — Telephone Encounter (Signed)
NOT SURE HOW IT WAS SENT TO [place], [place]. HER PHARMACY SHOWS [facility] [place] BUT RX WAS SENT TO [place]. CALL THE PRESCRIPTION TO THE [facility].

## 2018-09-12 NOTE — Telephone Encounter (Signed)
I spoke to Darlene Vazquez and he will use the one on file.  He will let me know if it needs a PA.

## 2018-09-12 NOTE — ED Notes (Signed)
Pt resting with eyes closed, appears to be in no distress. Respirations are even and unlabored. Will continue to monitor.  

## 2018-09-12 NOTE — Telephone Encounter (Signed)
It was sent to [place], [place]. But I can call it in if you would like.

## 2018-09-12 NOTE — ED Notes (Signed)
Patient transported to X-ray 

## 2018-09-20 MED ORDER — LINACLOTIDE 72 MCG PO CAPS: ORAL_CAPSULE | ORAL | 11 refills | Status: DC

## 2018-09-20 NOTE — Telephone Encounter (Addendum)
PLEASE CALL PT. Her insurance says she must try Linzess prior to getting Motegrity.  Rx SENT FOR LINZESS 72 MCG DAILY. PLEASE CALL WITH QUESTIONS OR CONCERNS.

## 2018-09-20 NOTE — Telephone Encounter (Signed)
Spoke with pt. Linzess caused a lot of Abdominal cramping and diarrhea. Pt was previously given Linzess samples.  Motegrity let pt have a bowel movement with easy minus the abdominal cramping and diarrhea.  Pt is aware that I will notify [facility]. Sample of Motegrity left up front for pt.

## 2018-09-20 NOTE — Telephone Encounter (Addendum)
I called [facility] and spoke to [facility] and she said pt needs to try one of the preferred meds first. Either Linzess  Or Movantik.  Forwarding to Dr. Darrick Penna to advise!  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

## 2018-09-20 NOTE — Addendum Note (Signed)
Addended by: Jonette Eva L on: 09/20/2018 02:00 PM   Modules accepted: Orders

## 2018-09-21 ENCOUNTER — Encounter: Payer: Self-pay | Admitting: Gastroenterology

## 2018-09-21 NOTE — Addendum Note (Signed)
Addended by: [place] on: 09/21/2018 09:22 AM   Modules accepted: Orders

## 2018-09-21 NOTE — Telephone Encounter (Signed)
routing message

## 2018-09-21 NOTE — Telephone Encounter (Signed)
Per Helmut Muster, she will call pt.

## 2018-09-21 NOTE — Telephone Encounter (Signed)
PLEASE CALL PT. PROVIDE PT WITH SAMPLES OF MOVATNTIK 12.5 PO DAILY  FOR ONE WEEK.  SHE SHOULD ONLY TAKE MOVANTIK TO SEE IF IT WORKS. SHE CAN ALSO KEEP THE MOTEGRITY SAMPLES.

## 2018-09-21 NOTE — Telephone Encounter (Signed)
There are only samples of Movantik 25 mg, can pt cut this pill in half? Please advise.

## 2018-09-22 NOTE — ED Provider Notes (Signed)
[facility] EMERGENCY DEPARTMENT Provider Note   CSN: [id number] Arrival date & time: 09/12/18  0732    History   Chief Complaint Chief Complaint  Patient presents with   Back Pain    HPI Darlene Vazquez is a 33 y.o. female.  HPI   33 year old female with lower back pain and into the right flank.  Onset 3 days ago.  Symptoms waxed and waned since onset.  No appreciable exacerbating factors.  No urinary complaints.  No trauma or strain.  She reports that she last had a bowel movement yesterday but she has a past history of constipation and was recently started on a new medication for it.  Past Medical History:  Diagnosis Date   GERD (gastroesophageal reflux disease)     Patient Active Problem List   Diagnosis Date Noted   Constipation 04/12/2018   GERD (gastroesophageal reflux disease)    Abdominal pain, periumbilical 08/06/2014   Ruptured ectopic pregnancy 11/09/2013    Past Surgical History:  Procedure Laterality Date   DIAGNOSTIC LAPAROSCOPY WITH REMOVAL OF ECTOPIC PREGNANCY Right 11/09/2013   Procedure: DIAGNOSTIC LAPAROSCOPY WITH REMOVAL OF ECTOPIC PREGNANCY;  Surgeon: Lazaro Arms, MD;  Location: [facility];  Service: Gynecology;  Laterality: Right;   ESOPHAGOGASTRODUODENOSCOPY N/A 08/12/2014   SLF: 1. No obvious source for epigastric pain identified. 2. Mild gastiritis.    LAPAROSCOPY N/A 11/09/2013   Procedure: LAPAROSCOPY OPERATIVE;  Surgeon: Lazaro Arms, MD;  Location: [facility];  Service: Gynecology;  Laterality: N/A;   UNILATERAL SALPINGECTOMY Right 11/09/2013   Procedure: UNILATERAL SALPINGECTOMY;  Surgeon: Lazaro Arms, MD;  Location: [facility];  Service: Gynecology;  Laterality: Right;     OB History    Gravida  1   Para      Term      Preterm      AB      Living        SAB      TAB      Ectopic      Multiple      Live Births               Home Medications    Prior to Admission medications   Medication Sig Start Date End Date  Taking? Authorizing Provider  pantoprazole (PROTONIX) 40 MG tablet Take 1 tablet (40 mg total) by mouth daily. Patient taking differently: Take 40 mg by mouth daily as needed.  12/27/16  Yes Donnetta Hutching, MD  Prucalopride Succinate (MOTEGRITY) 2 MG TABS Take 2 mg by mouth daily. 09/12/18  Yes Fields, Darleene Cleaver, MD  HYDROcodone-acetaminophen (NORCO/VICODIN) 5-325 MG tablet Take 1 tablet by mouth every 4 (four) hours as needed. 09/12/18   Raeford Razor, MD    Family History Family History  Problem Relation Age of Onset   Wilson's disease Maternal Grandfather    Colon cancer Neg Hx    Colon polyps Neg Hx     Social History Social History   Tobacco Use   Smoking status: Never Smoker   Smokeless tobacco: Never Used   Tobacco comment: Never smoked  Substance Use Topics   Alcohol use: No    Alcohol/week: 0.0 standard drinks   Drug use: No     Allergies   Linzess [linaclotide] and Other   Review of Systems Review of Systems All systems reviewed and negative, other than as noted in HPI.   Physical Exam Updated Vital Signs BP 123/82 (BP Location: Right Arm)    Pulse  86    Temp 98.6 F (37 C) (Oral)    Resp 18    Ht  (1.549 m)    Wt 78.9 kg    LMP 09/01/2018    SpO2 100%    BMI 32.88 kg/m   Physical Exam Vitals signs and nursing note reviewed.  Constitutional:      General: She is not in acute distress.    Appearance: She is well-developed.  HENT:     Head: Normocephalic and atraumatic.  Eyes:     General:        Right eye: No discharge.        Left eye: No discharge.     Conjunctiva/sclera: Conjunctivae normal.  Neck:     Musculoskeletal: Neck supple.  Cardiovascular:     Rate and Rhythm: Normal rate and regular rhythm.     Heart sounds: Normal heart sounds. No murmur. No friction rub. No gallop.   Pulmonary:     Effort: Pulmonary effort is normal. No respiratory distress.     Breath sounds: Normal breath sounds.  Abdominal:     General: There is no  distension.     Palpations: Abdomen is soft.     Tenderness: There is abdominal tenderness.     Comments: Tenderness to palpation in the right abdomen right flank.  No rebound or guarding.  No CVA tenderness.  Musculoskeletal:        General: No tenderness.  Skin:    General: Skin is warm and dry.  Neurological:     Mental Status: She is alert.  Psychiatric:        Behavior: Behavior normal.        Thought Content: Thought content normal.      ED Treatments / Results  Labs (all labs ordered are listed, but only abnormal results are displayed) Labs Reviewed  URINALYSIS, ROUTINE W REFLEX MICROSCOPIC - Abnormal; Notable for the following components:      Result Value   APPearance HAZY (*)    Hgb urine dipstick LARGE (*)    All other components within normal limits  PREGNANCY, URINE    EKG None  Radiology No results found.   US Pelvis Transvanginal Non-ob (tv Only)  Result Date: 09/12/2018 CLINICAL DATA:  Right flank pain. Right ovarian cyst seen on CT. Evaluate for torsion. History of right salpingectomy. EXAM: TRANSABDOMINAL AND TRANSVAGINAL ULTRASOUND OF PELVIS DOPPLER ULTRASOUND OF OVARIES TECHNIQUE: Both transabdominal and transvaginal ultrasound examinations of the pelvis were performed. Transabdominal technique was performed for global imaging of the pelvis including uterus, ovaries, adnexal regions, and pelvic cul-de-sac. It was necessary to proceed with endovaginal exam following the transabdominal exam to visualize the uterus and endometrium. Color and duplex Doppler ultrasound was utilized to evaluate blood flow to the ovaries. COMPARISON:  CT abdomen pelvis from same day. Pelvic ultrasound dated Nov 09, 2013. FINDINGS: Uterus Measurements: 8.2 x 4.6 x 5.7 cm = volume: 112 mL. Two small subserosal fibroids identified measuring up to 2.0 cm. Endometrium Thickness: 6 mm.  No focal abnormality visualized. Right ovary Measurements: 3.3 x 1.6 x 2.0 cm = volume: 6 mL. Normal  appearance/no adnexal mass. Left ovary Measurements: 6.5 x 4.2 x 6.6 cm = volume: 94 mL. There is a 6.3 x 3.9 x 5.9 cm simple cyst. Pulsed Doppler evaluation of both ovaries demonstrates normal low-resistance arterial and venous waveforms. Other findings No abnormal free fluid. IMPRESSION: 1. No acute findings.  No sonographic evidence of ovarian torsion. 2. 6.3 cm  left ovarian simple cyst. This has benign characteristics, but given its size, follow up by ultrasound is recommended in 6 months. This recommendation follows consensus guidelines: Simple Adnexal Cysts: SRU Consensus Conference Update on Follow-up and Reporting. Radiology 2019; [id number]. 3. Normal right ovary. Please note that on the CT the left ovary crosses the midline and extends into the right pelvis. 4. Fibroid uterus. Electronically Signed   By: Obie Dredge M.D.   On: 09/12/2018 12:49   US Pelvis Complete  Result Date: 09/12/2018 CLINICAL DATA:  Right flank pain. Right ovarian cyst seen on CT. Evaluate for torsion. History of right salpingectomy. EXAM: TRANSABDOMINAL AND TRANSVAGINAL ULTRASOUND OF PELVIS DOPPLER ULTRASOUND OF OVARIES TECHNIQUE: Both transabdominal and transvaginal ultrasound examinations of the pelvis were performed. Transabdominal technique was performed for global imaging of the pelvis including uterus, ovaries, adnexal regions, and pelvic cul-de-sac. It was necessary to proceed with endovaginal exam following the transabdominal exam to visualize the uterus and endometrium. Color and duplex Doppler ultrasound was utilized to evaluate blood flow to the ovaries. COMPARISON:  CT abdomen pelvis from same day. Pelvic ultrasound dated Nov 09, 2013. FINDINGS: Uterus Measurements: 8.2 x 4.6 x 5.7 cm = volume: 112 mL. Two small subserosal fibroids identified measuring up to 2.0 cm. Endometrium Thickness: 6 mm.  No focal abnormality visualized. Right ovary Measurements: 3.3 x 1.6 x 2.0 cm = volume: 6 mL. Normal appearance/no  adnexal mass. Left ovary Measurements: 6.5 x 4.2 x 6.6 cm = volume: 94 mL. There is a 6.3 x 3.9 x 5.9 cm simple cyst. Pulsed Doppler evaluation of both ovaries demonstrates normal low-resistance arterial and venous waveforms. Other findings No abnormal free fluid. IMPRESSION: 1. No acute findings.  No sonographic evidence of ovarian torsion. 2. 6.3 cm left ovarian simple cyst. This has benign characteristics, but given its size, follow up by ultrasound is recommended in 6 months. This recommendation follows consensus guidelines: Simple Adnexal Cysts: SRU Consensus Conference Update on Follow-up and Reporting. Radiology 2019; [id number]. 3. Normal right ovary. Please note that on the CT the left ovary crosses the midline and extends into the right pelvis. 4. Fibroid uterus. Electronically Signed   By: Obie Dredge M.D.   On: 09/12/2018 12:49   US Pelvic Doppler (torsion R/o Or Mass Arterial Flow)  Result Date: 09/12/2018 CLINICAL DATA:  Right flank pain. Right ovarian cyst seen on CT. Evaluate for torsion. History of right salpingectomy. EXAM: TRANSABDOMINAL AND TRANSVAGINAL ULTRASOUND OF PELVIS DOPPLER ULTRASOUND OF OVARIES TECHNIQUE: Both transabdominal and transvaginal ultrasound examinations of the pelvis were performed. Transabdominal technique was performed for global imaging of the pelvis including uterus, ovaries, adnexal regions, and pelvic cul-de-sac. It was necessary to proceed with endovaginal exam following the transabdominal exam to visualize the uterus and endometrium. Color and duplex Doppler ultrasound was utilized to evaluate blood flow to the ovaries. COMPARISON:  CT abdomen pelvis from same day. Pelvic ultrasound dated Nov 09, 2013. FINDINGS: Uterus Measurements: 8.2 x 4.6 x 5.7 cm = volume: 112 mL. Two small subserosal fibroids identified measuring up to 2.0 cm. Endometrium Thickness: 6 mm.  No focal abnormality visualized. Right ovary Measurements: 3.3 x 1.6 x 2.0 cm = volume: 6 mL.  Normal appearance/no adnexal mass. Left ovary Measurements: 6.5 x 4.2 x 6.6 cm = volume: 94 mL. There is a 6.3 x 3.9 x 5.9 cm simple cyst. Pulsed Doppler evaluation of both ovaries demonstrates normal low-resistance arterial and venous waveforms. Other findings No abnormal free fluid. IMPRESSION: 1. No acute findings.  No sonographic evidence of ovarian torsion. 2. 6.3 cm left ovarian simple cyst. This has benign characteristics, but given its size, follow up by ultrasound is recommended in 6 months. This recommendation follows consensus guidelines: Simple Adnexal Cysts: SRU Consensus Conference Update on Follow-up and Reporting. Radiology 2019; [id number]. 3. Normal right ovary. Please note that on the CT the left ovary crosses the midline and extends into the right pelvis. 4. Fibroid uterus. Electronically Signed   By: Obie Dredge M.D.   On: 09/12/2018 12:49   Ct Renal Stone Study  Result Date: 09/12/2018 CLINICAL DATA:  Right flank region pain EXAM: CT ABDOMEN AND PELVIS WITHOUT CONTRAST TECHNIQUE: Multidetector CT imaging of the abdomen and pelvis was performed following the standard protocol without oral or IV contrast. COMPARISON:  None. FINDINGS: Lower chest: Lung bases are clear. Hepatobiliary: No focal liver lesions are apparent on this noncontrast enhanced study. The gallbladder wall is not appreciably thickened. There is no biliary duct dilatation. Pancreas: There is no pancreatic mass or inflammatory focus. Spleen: No splenic lesions are evident. Adrenals/Urinary Tract: Adrenals bilaterally appear unremarkable. Kidneys bilaterally show no evident mass or hydronephrosis on either side. There is a 3 mm calculus in the mid right kidney, nonobstructing. There is no appreciable ureteral calculus on either side. Urinary bladder is midline with wall thickness within normal limits. Stomach/Bowel: There is moderate stool throughout the colon. There is no appreciable bowel wall or mesenteric thickening.  There is no evident bowel obstruction. No free air or portal venous air. Note that there are scattered diverticula throughout the colon without diverticulitis. Vascular/Lymphatic: No abdominal aortic aneurysm. No evident vascular lesions. No adenopathy in the abdomen or pelvis by size criteria. Note that there are multiple subcentimeter lymph nodes in the right mid to lower abdomen. Reproductive: Uterus is anteverted. The uterus has a somewhat lobulated contour, likely due to leiomyomatous change. There is a surgical clip in the right adnexal region. There is 5.6 x 5.4 x 4.6 cm. No other pelvic mass evident. Other: Appendix appears normal. No abscess or ascites evident in the abdomen or pelvis. There is a ventral hernia containing fat and soft tissue stranding, likely panniculitis. No bowel extends into this hernia. Musculoskeletal: No demonstrable blastic or lytic bone lesion. There is no intramuscular lesion. IMPRESSION: 1. 3 mm nonobstructing calculus mid right kidney. No hydronephrosis or ureteral calculus on either side. 2. Right adnexal cyst measuring 5.6 x 5.4 x 4.6 cm. Surgical clip in the right adnexal region, likely due to previous salpingectomy. Given the size of this cyst on the right, correlation with pelvic ultrasound including Doppler assessment advised. A cyst of this size could cause intermittent adnexal/ovarian torsion. 3. Scattered colonic diverticula without diverticulitis. No bowel obstruction. No abscess in the abdomen or pelvis. Appendix appears normal. 4. Ventral hernia containing fat and probable panniculitis change. No bowel extends into this hernia. 5. Subcentimeter lymph nodes in the right abdomen. This finding is considered nonspecific. In the appropriate clinical setting, a degree of mesenteric adenitis could present in this manner. Electronically Signed   By: Bretta Bang III M.D.   On: 09/12/2018 10:14    Procedures Procedures (including critical care time)  Medications  Ordered in ED Medications  ibuprofen (ADVIL,MOTRIN) tablet 600 mg (600 mg Oral Given 09/12/18 0846)  HYDROcodone-acetaminophen (NORCO/VICODIN) 5-325 MG per tablet 1 tablet (1 tablet Oral Given 09/12/18 0846)     Initial Impression / Assessment and Plan / ED Course  I have reviewed the triage vital signs and  the nursing notes.  Pertinent labs & imaging results that were available during my care of the patient were reviewed by me and considered in my medical decision making (see chart for details).        33 year old female with right abdominal pain.  Likely secondary to ovarian cyst noted on US.  No evidence of torsion on ultrasound.  Plan symptomatic treatment.  Return precautions were discussed.  Outpatient follow-up otherwise.  Final Clinical Impressions(s) / ED Diagnoses   Final diagnoses:  RLQ abdominal pain  Cyst of ovary, unspecified laterality    ED Discharge Orders         Ordered    HYDROcodone-acetaminophen (NORCO/VICODIN) 5-325 MG tablet  Every 4 hours PRN     09/12/18 1346           Raeford RazorKohut, Porscha Axley, MD 09/22/18 1817

## 2018-09-23 MED ORDER — NALOXEGOL OXALATE 12.5 MG PO TABS: 12.5000 mg | ORAL_TABLET | Freq: Every day | ORAL | 3 refills | Status: DC

## 2018-09-23 NOTE — Addendum Note (Signed)
Addended by: [place] on: 09/23/2018 12:06 PM   Modules accepted: Orders

## 2018-09-23 NOTE — Telephone Encounter (Signed)
PLEASE CALL PT. WE DO NOT HAVE MOVANTIK 12.5 MG SAMPLES. I SENT RX FOR MOVANTIK TO [facility].

## 2018-09-24 NOTE — Telephone Encounter (Signed)
Pt notified that Movantik 12.5 mg was called into her pharmacy.

## 2019-01-05 ENCOUNTER — Other Ambulatory Visit: Payer: Self-pay

## 2019-01-05 ENCOUNTER — Encounter (HOSPITAL_COMMUNITY): Payer: Self-pay | Admitting: Emergency Medicine

## 2019-01-05 ENCOUNTER — Emergency Department (HOSPITAL_COMMUNITY)
Admission: EM | Admit: 2019-01-05 | Discharge: 2019-01-05 | Disposition: A | Discharge status: Home / Self Care | Payer: Medicaid Other | Attending: Emergency Medicine | Admitting: Emergency Medicine

## 2019-01-05 ENCOUNTER — Emergency Department (HOSPITAL_COMMUNITY): Payer: Medicaid Other

## 2019-01-05 DIAGNOSIS — R202 Paresthesia of skin: Secondary | ICD-10-CM

## 2019-01-05 DIAGNOSIS — R200 Anesthesia of skin: Secondary | ICD-10-CM | POA: Diagnosis present

## 2019-01-05 LAB — CBC WITH DIFFERENTIAL/PLATELET
Abs Immature Granulocytes: 0.02 10*3/uL (ref 0.00–0.07)
Basophils Absolute: 0 10*3/uL (ref 0.0–0.1)
Basophils Relative: 1 %
Eosinophils Absolute: 0.1 10*3/uL (ref 0.0–0.5)
Eosinophils Relative: 1 %
HCT: 41.2 % (ref 36.0–46.0)
Hemoglobin: 13.4 g/dL (ref 12.0–15.0)
Immature Granulocytes: 0 %
Lymphocytes Relative: 44 %
Lymphs Abs: 3.3 10*3/uL (ref 0.7–4.0)
MCH: 28.3 pg (ref 26.0–34.0)
MCHC: 32.5 g/dL (ref 30.0–36.0)
MCV: 87.1 fL (ref 80.0–100.0)
Monocytes Absolute: 0.6 10*3/uL (ref 0.1–1.0)
Monocytes Relative: 9 %
Neutro Abs: 3.3 10*3/uL (ref 1.7–7.7)
Neutrophils Relative %: 45 %
Platelets: 393 10*3/uL (ref 150–400)
RBC: 4.73 MIL/uL (ref 3.87–5.11)
RDW: 13.1 % (ref 11.5–15.5)
WBC: 7.4 10*3/uL (ref 4.0–10.5)
nRBC: 0 % (ref 0.0–0.2)

## 2019-01-05 LAB — COMPREHENSIVE METABOLIC PANEL
ALT: 17 U/L (ref 0–44)
AST: 13 U/L — ABNORMAL LOW (ref 15–41)
Albumin: 4.3 g/dL (ref 3.5–5.0)
Alkaline Phosphatase: 75 U/L (ref 38–126)
Anion gap: 11 (ref 5–15)
BUN: 11 mg/dL (ref 6–20)
CO2: 24 mmol/L (ref 22–32)
Calcium: 9.3 mg/dL (ref 8.9–10.3)
Chloride: 104 mmol/L (ref 98–111)
Creatinine, Ser: 0.7 mg/dL (ref 0.44–1.00)
GFR calc Af Amer: >60 mL/min (ref >60)
GFR calc non Af Amer: >60 mL/min (ref >60)
Glucose, Bld: 113 mg/dL — ABNORMAL HIGH (ref 70–99)
Potassium: 3.4 mmol/L — ABNORMAL LOW (ref 3.5–5.1)
Sodium: 139 mmol/L (ref 135–145)
Total Bilirubin: 0.5 mg/dL (ref 0.3–1.2)
Total Protein: 7.5 g/dL (ref 6.5–8.1)

## 2019-01-05 LAB — TROPONIN I (HIGH SENSITIVITY)
Troponin I (High Sensitivity): <2 ng/L (ref <18)
Troponin I (High Sensitivity): 2 ng/L (ref <18)

## 2019-01-05 MED ORDER — NAPROXEN 500 MG PO TABS: 500.0000 mg | ORAL_TABLET | Freq: Two times a day (BID) | ORAL | 0 refills | Status: DC

## 2019-01-05 MED ORDER — LORAZEPAM 2 MG/ML IJ SOLN
1.0000 mg | Freq: Once | INTRAMUSCULAR | Status: AC
  Administered 2019-01-05: 19:00:00 1 mg via INTRAVENOUS
  Filled 2019-01-05: qty 1

## 2019-01-05 NOTE — ED Notes (Signed)
Pt ambulated to BR with only stand-by assistance. Pt stated that her hand was still numb.

## 2019-01-05 NOTE — ED Provider Notes (Signed)
[facility] EMERGENCY DEPARTMENT Provider Note   CSN: [id number] Arrival date & time: 01/05/19  1728     History   Chief Complaint Chief Complaint  Patient presents with  . Numbness    HPI Darlene Vazquez is a 33 y.o. female.     Pt complains of numbness to left arm and anxiety  The history is provided by the patient. No language interpreter was used.  Weakness Severity:  Moderate Onset quality:  Sudden Timing:  Constant Progression:  Worsening Chronicity:  New Context: not alcohol use   Relieved by:  Nothing Worsened by:  Nothing Ineffective treatments:  None tried Associated symptoms: no abdominal pain, no chest pain, no cough, no diarrhea, no frequency, no headaches and no seizures     Past Medical History:  Diagnosis Date  . GERD (gastroesophageal reflux disease)     Patient Active Problem List   Diagnosis Date Noted  . Constipation 04/12/2018  . GERD (gastroesophageal reflux disease)   . Abdominal pain, periumbilical 08/06/2014  . Ruptured ectopic pregnancy 11/09/2013    Past Surgical History:  Procedure Laterality Date  . DIAGNOSTIC LAPAROSCOPY WITH REMOVAL OF ECTOPIC PREGNANCY Right 11/09/2013   Procedure: DIAGNOSTIC LAPAROSCOPY WITH REMOVAL OF ECTOPIC PREGNANCY;  Surgeon: Lazaro ArmsLuther H Eure, MD;  Location: [facility];  Service: Gynecology;  Laterality: Right;  . ESOPHAGOGASTRODUODENOSCOPY N/A 08/12/2014   SLF: 1. No obvious source for epigastric pain identified. 2. Mild gastiritis.   Marland Kitchen. LAPAROSCOPY N/A 11/09/2013   Procedure: LAPAROSCOPY OPERATIVE;  Surgeon: Lazaro ArmsLuther H Eure, MD;  Location: [facility];  Service: Gynecology;  Laterality: N/A;  . UNILATERAL SALPINGECTOMY Right 11/09/2013   Procedure: UNILATERAL SALPINGECTOMY;  Surgeon: Lazaro ArmsLuther H Eure, MD;  Location: [facility];  Service: Gynecology;  Laterality: Right;     OB History    Gravida  1   Para      Term      Preterm      AB      Living        SAB      TAB      Ectopic      Multiple      Live Births                Home Medications    Prior to Admission medications   Medication Sig Start Date End Date Taking? Authorizing Provider  ibuprofen (ADVIL) 200 MG tablet Take 200 mg by mouth daily as needed for mild pain or moderate pain.   Yes [provider]  HYDROcodone-acetaminophen (NORCO/VICODIN) 5-325 MG tablet Take 1 tablet by mouth every 4 (four) hours as needed. Patient not taking: Reported on 01/05/2019 09/12/18   Raeford RazorKohut, Stephen, MD  naloxegol oxalate (MOVANTIK) 12.5 MG TABS tablet Take 1 tablet (12.5 mg total) by mouth daily. Patient not taking: Reported on 01/05/2019 09/23/18   West BaliFields, Sandi L, MD  naproxen (NAPROSYN) 500 MG tablet Take 1 tablet (500 mg total) by mouth 2 (two) times daily. 01/05/19   Bethann BerkshireZammit, Senai Ramnath, MD  pantoprazole (PROTONIX) 40 MG tablet Take 1 tablet (40 mg total) by mouth daily. Patient not taking: Reported on 01/05/2019 12/27/16   Donnetta Hutchingook, Brian, MD  Prucalopride Succinate (MOTEGRITY) 2 MG TABS Take 2 mg by mouth daily. Patient not taking: Reported on 01/05/2019 09/12/18   West BaliFields, Sandi L, MD    Family History Family History  Problem Relation Age of Onset  . Wilson's disease Maternal Grandfather   . Colon cancer Neg Hx   . Colon  polyps Neg Hx     Social History Social History   Tobacco Use  . Smoking status: Never Smoker  . Smokeless tobacco: Never Used  . Tobacco comment: Never smoked  Substance Use Topics  . Alcohol use: No    Alcohol/week: 0.0 standard drinks  . Drug use: No     Allergies   Linzess [linaclotide] and Other   Review of Systems Review of Systems  Constitutional: Negative for appetite change and fatigue.  HENT: Negative for congestion, ear discharge and sinus pressure.   Eyes: Negative for discharge.  Respiratory: Negative for cough.   Cardiovascular: Negative for chest pain.  Gastrointestinal: Negative for abdominal pain and diarrhea.  Genitourinary: Negative for frequency and hematuria.  Musculoskeletal: Negative for  back pain.       Hand numbness  Skin: Negative for rash.  Neurological: Positive for weakness. Negative for seizures and headaches.  Psychiatric/Behavioral: Negative for hallucinations.     Physical Exam Updated Vital Signs BP (!) 131/94   Pulse (!) 106   Temp 98.4 F (36.9 C) (Oral)   Resp (!) 21   Ht 5\' 2"  (1.575 m)   Wt 81.6 kg   SpO2 100%   BMI 32.92 kg/m   Physical Exam Vitals signs and nursing note reviewed.  Constitutional:      Appearance: She is well-developed.  HENT:     Head: Normocephalic.     Nose: Nose normal.  Eyes:     General: No scleral icterus.    Conjunctiva/sclera: Conjunctivae normal.  Neck:     Musculoskeletal: Neck supple.     Thyroid: No thyromegaly.  Cardiovascular:     Rate and Rhythm: Normal rate and regular rhythm.     Heart sounds: No murmur. No friction rub. No gallop.   Pulmonary:     Breath sounds: No stridor. No wheezing or rales.  Chest:     Chest wall: No tenderness.  Abdominal:     General: There is no distension.     Tenderness: There is no abdominal tenderness. There is no rebound.  Musculoskeletal:     Comments: Numbness to left foream.  Excellent capillary refill and normal pulses  Lymphadenopathy:     Cervical: No cervical adenopathy.  Skin:    Findings: No erythema or rash.  Neurological:     Mental Status: She is alert and oriented to person, place, and time.     Motor: No abnormal muscle tone.     Coordination: Coordination normal.  Psychiatric:        Behavior: Behavior normal.      ED Treatments / Results  Labs (all labs ordered are listed, but only abnormal results are displayed) Labs Reviewed  COMPREHENSIVE METABOLIC PANEL - Abnormal; Notable for the following components:      Result Value   Potassium 3.4 (*)    Glucose, Bld 113 (*)    AST 13 (*)    All other components within normal limits  CBC WITH DIFFERENTIAL/PLATELET  TROPONIN I (HIGH SENSITIVITY)  TROPONIN I (HIGH SENSITIVITY)    EKG EKG  Interpretation  Date/Time:  Saturday January 05 2019 17:40:57 EDT Ventricular Rate:  151 PR Interval:  124 QRS Duration: 80 QT Interval:  326 QTC Calculation: 516 R Axis:   -4 Text Interpretation:  Sinus tachycardia Nonspecific ST and T wave abnormality Abnormal ECG Confirmed by Bethann BerkshireZammit, Halo Laski [phone]([id number]) on 01/05/2019 5:52:36 PM   Radiology Dg Chest 2 View  Result Date: 01/05/2019 CLINICAL DATA:  Numbness in left  hand. EXAM: CHEST - 2 VIEW COMPARISON:  None. FINDINGS: The heart size and mediastinal contours are within normal limits. Both lungs are clear. The visualized skeletal structures are unremarkable. IMPRESSION: No active cardiopulmonary disease. Electronically Signed   By: Dorise Bullion III M.D   On: 01/05/2019 18:52   Ct Head Wo Contrast  Result Date: 01/05/2019 CLINICAL DATA:  Altered consciousness.  Numbness in left hand. EXAM: CT HEAD WITHOUT CONTRAST TECHNIQUE: Contiguous axial images were obtained from the base of the skull through the vertex without intravenous contrast. COMPARISON:  None. FINDINGS: Brain: No mass lesion, hemorrhage, hydrocephalus, acute infarct, intra-axial, or extra-axial fluid collection. Vascular: No hyperdense vessel or unexpected calcification. Skull: Normal Sinuses/Orbits: Normal imaged portions of the orbits and globes. Clear paranasal sinuses and mastoid air cells. Other: None. IMPRESSION: Normal head CT. Electronically Signed   By: Abigail Miyamoto M.D.   On: 01/05/2019 18:59    Procedures Procedures (including critical care time)  Medications Ordered in ED Medications  LORazepam (ATIVAN) injection 1 mg (1 mg Intravenous Given 01/05/19 1840)     Initial Impression / Assessment and Plan / ED Course  I have reviewed the triage vital signs and the nursing notes.  Pertinent labs & imaging results that were available during my care of the patient were reviewed by me and considered in my medical decision making (see chart for details).      Numbness to left  arm.  Could be carpal tunnel.  She will be placed on Naprosyn and will follow-up with her PCP                                Final Clinical Impressions(s) / ED Diagnoses   Final diagnoses:  Paresthesia    ED Discharge Orders         Ordered    naproxen (NAPROSYN) 500 MG tablet  2 times daily     01/05/19 2026           Milton Ferguson, MD 01/05/19 2036

## 2019-01-05 NOTE — Discharge Instructions (Addendum)
Follow-up with your provider in 1 to 2 weeks if not improving

## 2019-01-05 NOTE — ED Triage Notes (Signed)
Pt c/o of numbness in her left hand starting yesterday at 1200. Now c/o of same numbness but radiating to the left side of her face started at 1430 today.

## 2019-08-19 ENCOUNTER — Other Ambulatory Visit: Payer: Self-pay | Admitting: Gastroenterology

## 2019-10-29 ENCOUNTER — Other Ambulatory Visit: Payer: Self-pay

## 2019-10-29 ENCOUNTER — Ambulatory Visit: Payer: Medicaid Other | Attending: Internal Medicine

## 2019-10-29 DIAGNOSIS — Z20822 Contact with and (suspected) exposure to covid-19: Secondary | ICD-10-CM

## 2019-10-30 LAB — SARS-COV-2, NAA 2 DAY TAT

## 2019-10-30 LAB — NOVEL CORONAVIRUS, NAA: SARS-CoV-2, NAA: NOT DETECTED

## 2020-11-25 ENCOUNTER — Ambulatory Visit: Payer: Medicaid Other | Admitting: Internal Medicine

## 2021-02-05 IMAGING — CT CT HEAD WITHOUT CONTRAST
3 series · 15 of 47 positions shown, 18 images · non-contrast
Comparison: None.

CLINICAL DATA: Altered consciousness.  Numbness in left hand.

EXAM:
CT HEAD WITHOUT CONTRAST
TECHNIQUE: Contiguous axial images were obtained from the base of the skull
through the vertex without intravenous contrast.

[Series 2: head w o · axial · 0.45mm/px · z∈[+1214,+1344]mm · 9 of 32 slices shown, 12 images]
[im 3/32  brain]
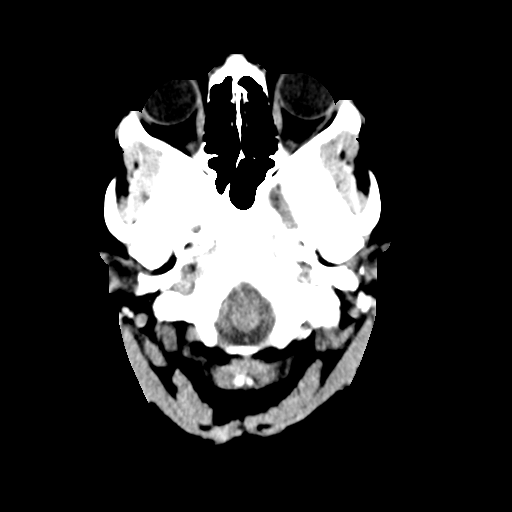
[im 3/32  bone]
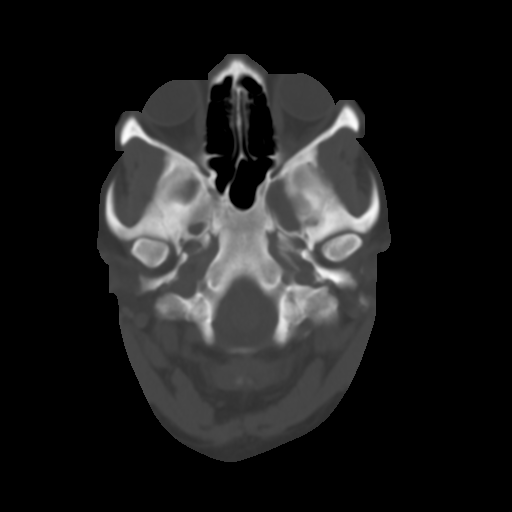
[im 6/32  brain]
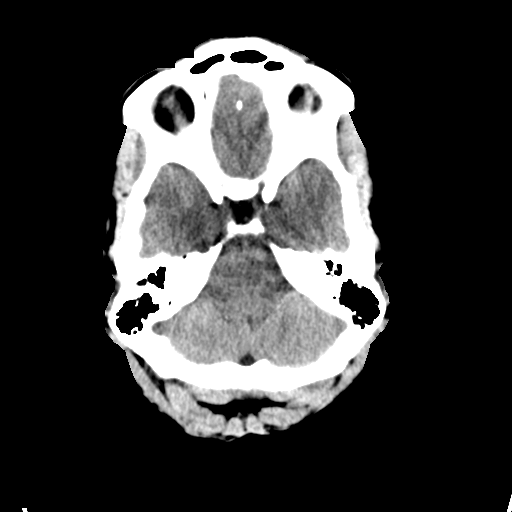
[im 9/32  brain]
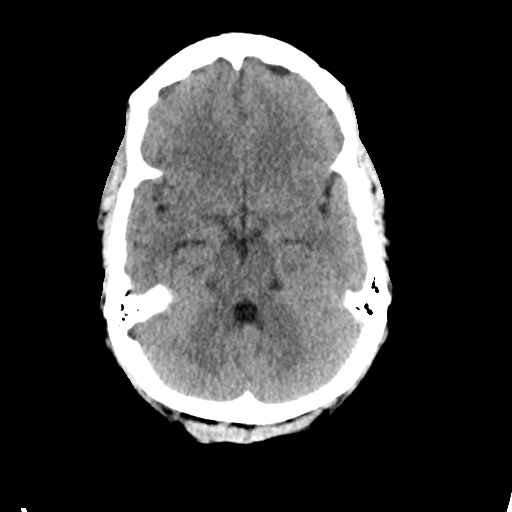
[im 12/32  brain]
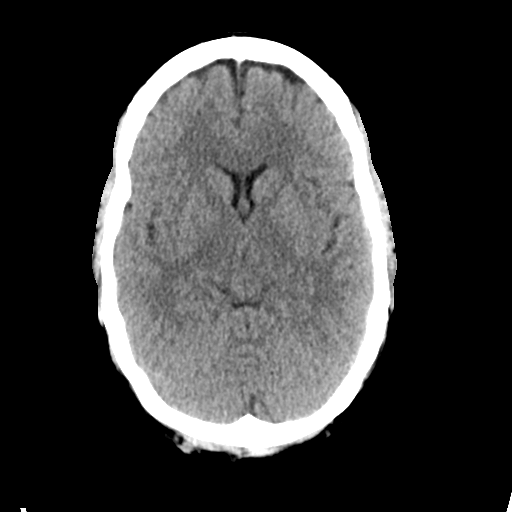
[im 17/32  brain]
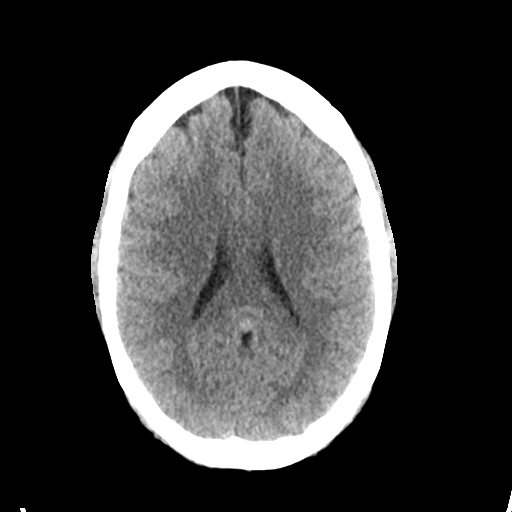
[im 17/32  bone]
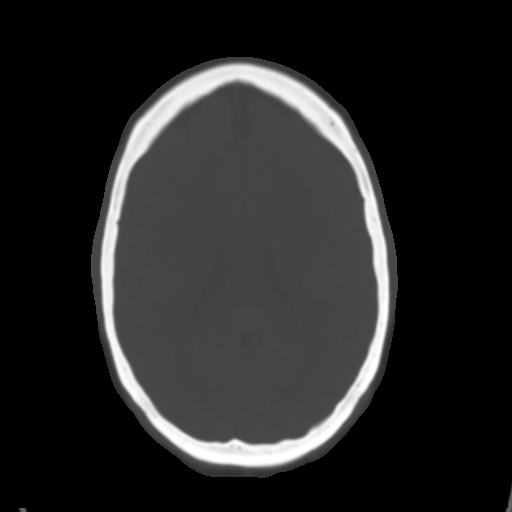
[im 20/32  brain]
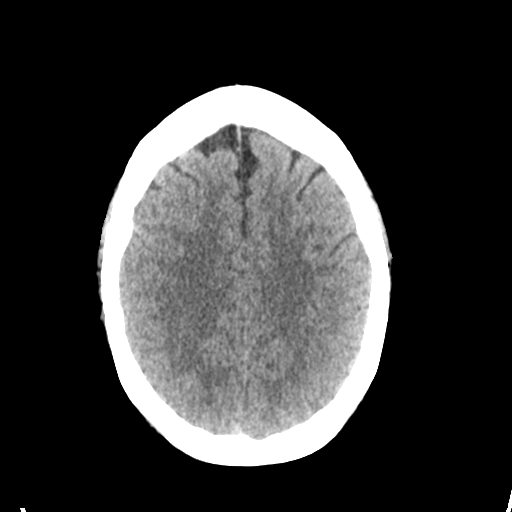
[im 23/32  brain]
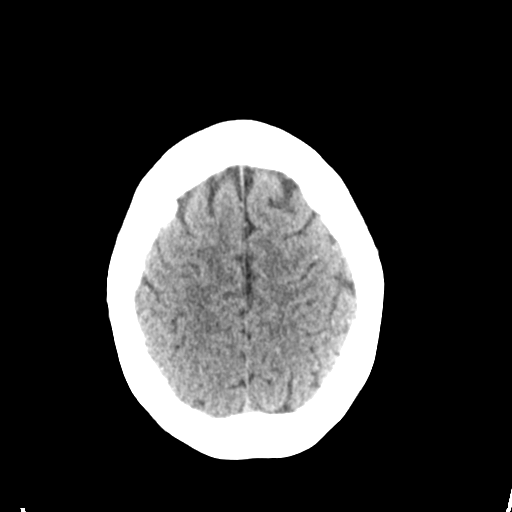
[im 26/32  brain]
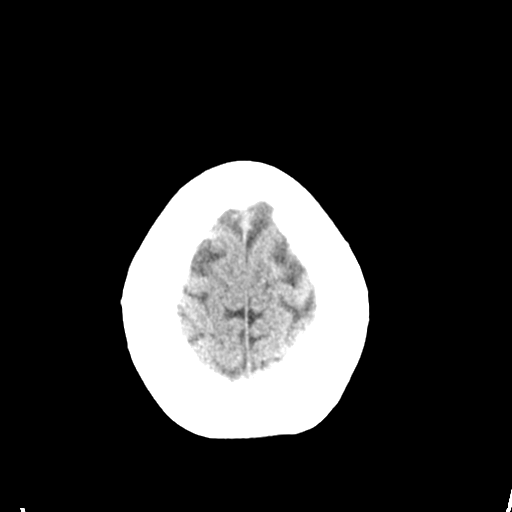
[im 29/32  brain]
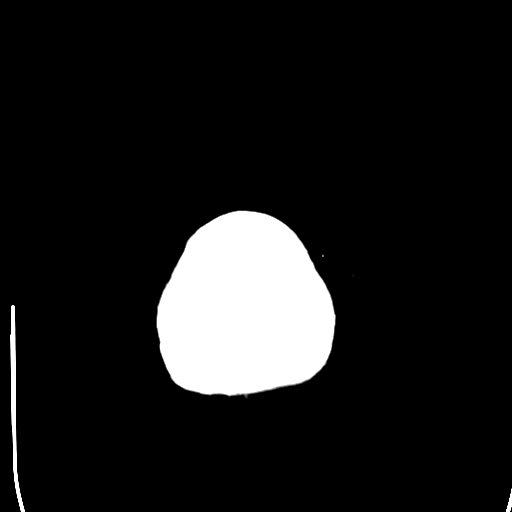
[im 29/32  bone]
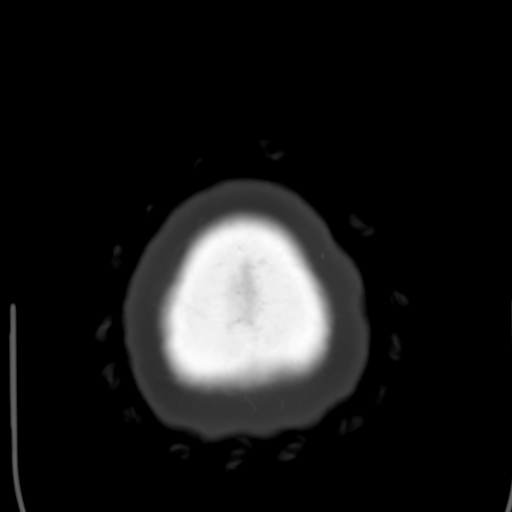

[Series 4: coronal soft · coronal · 0.31mm/px · 3 of 69 slices shown]
[im 23/69  brain]
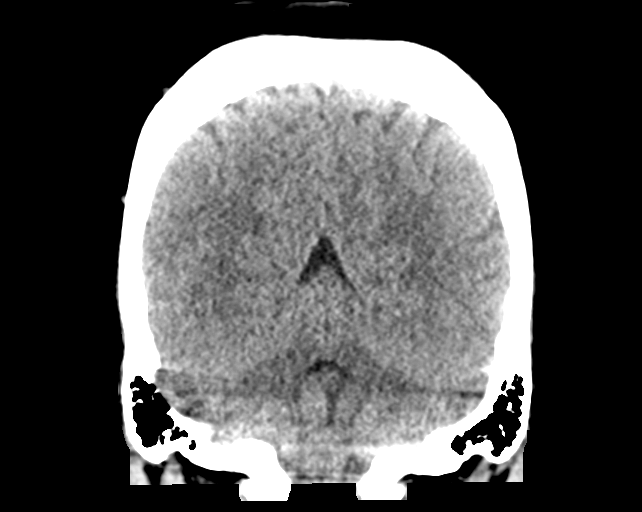
[im 31/69  brain]
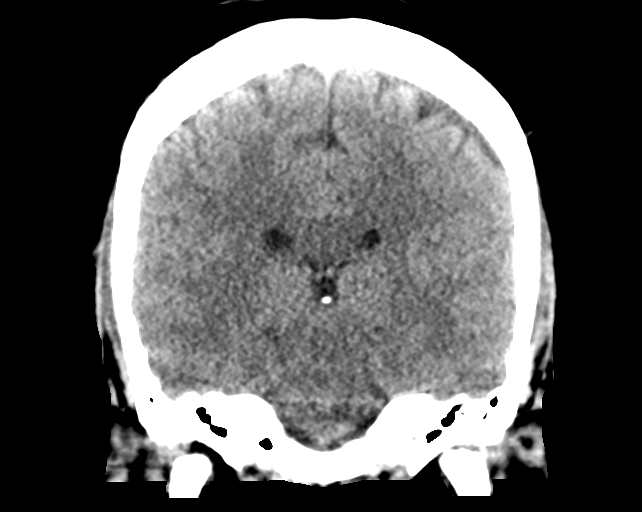
[im 38/69  brain]
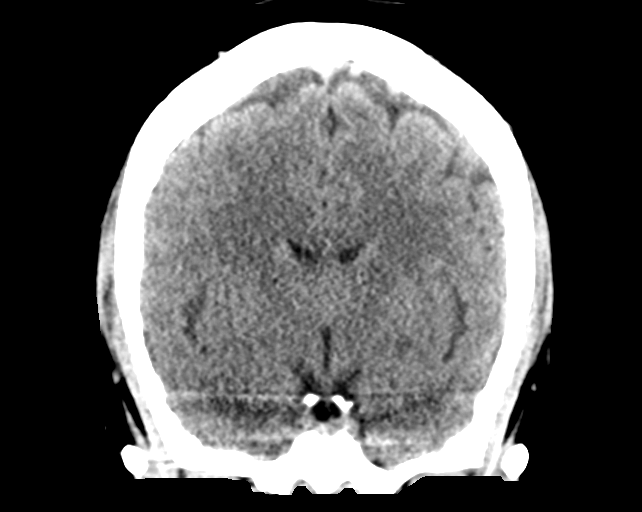

[Series 5: sagittal soft · sagittal · 0.32mm/px · 3 of 55 slices shown]
[im 19/55  brain]
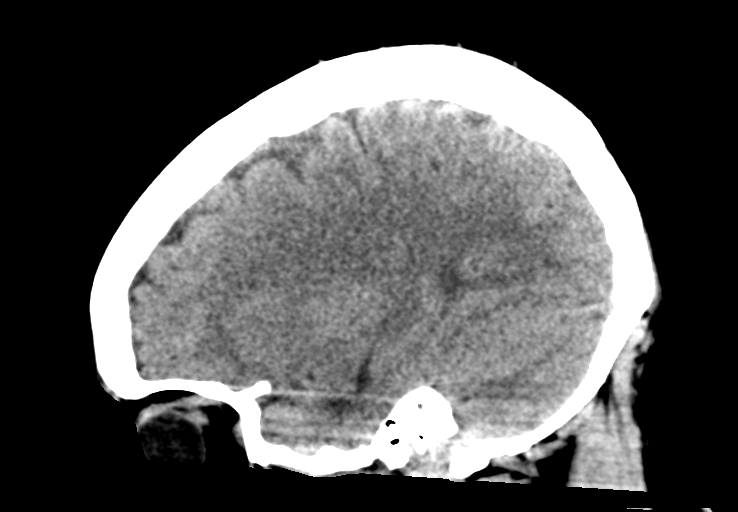
[im 28/55  brain]
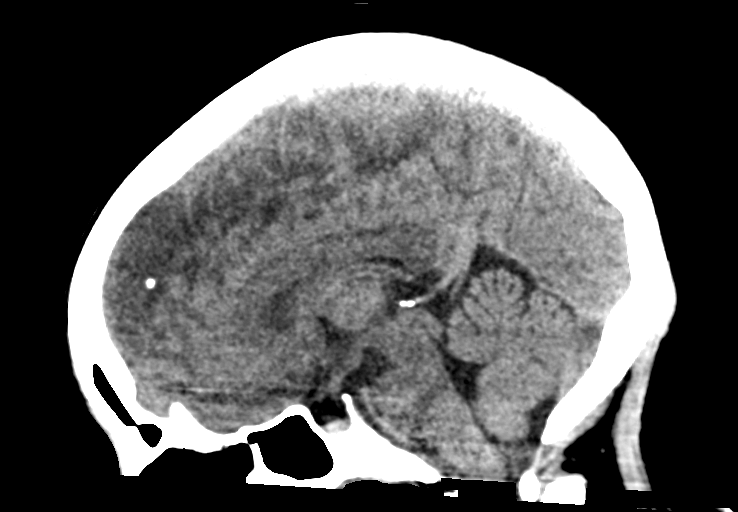
[im 37/55  brain]
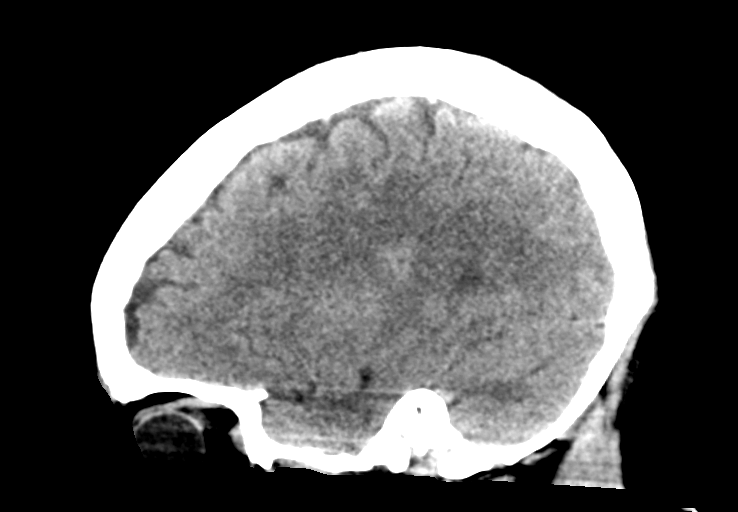

[15 of 47 positions shown; findings below may reference images not displayed]

FINDINGS: Brain: No mass lesion, hemorrhage, hydrocephalus, acute infarct,
intra-axial, or extra-axial fluid collection.

Vascular: No hyperdense vessel or unexpected calcification.

Skull: Normal

Sinuses/Orbits: Normal imaged portions of the orbits and globes.
Clear paranasal sinuses and mastoid air cells.

Other: None.
IMPRESSION: Normal head CT.

## 2022-11-03 ENCOUNTER — Other Ambulatory Visit: Payer: Self-pay

## 2022-11-03 ENCOUNTER — Other Ambulatory Visit (HOSPITAL_COMMUNITY): Payer: Self-pay

## 2022-11-03 MED ORDER — ATORVASTATIN CALCIUM 20 MG PO TABS
20.0000 mg | ORAL_TABLET | Freq: Every evening | ORAL | 1 refills | Status: DC
  Filled 2022-11-03: qty 30, 30d supply, fill #0

## 2022-12-04 ENCOUNTER — Other Ambulatory Visit (HOSPITAL_COMMUNITY): Payer: Self-pay

## 2022-12-05 ENCOUNTER — Other Ambulatory Visit (HOSPITAL_COMMUNITY): Payer: Self-pay

## 2022-12-05 MED ORDER — ATORVASTATIN CALCIUM 20 MG PO TABS
20.0000 mg | ORAL_TABLET | Freq: Every evening | ORAL | 0 refills | Status: DC
  Filled 2022-12-05: qty 30, 30d supply, fill #0

## 2022-12-06 ENCOUNTER — Other Ambulatory Visit (HOSPITAL_COMMUNITY): Payer: Self-pay

## 2022-12-30 ENCOUNTER — Other Ambulatory Visit (HOSPITAL_COMMUNITY): Payer: Self-pay

## 2023-01-02 ENCOUNTER — Other Ambulatory Visit (HOSPITAL_COMMUNITY): Payer: Self-pay

## 2023-01-06 ENCOUNTER — Other Ambulatory Visit (HOSPITAL_COMMUNITY): Payer: Self-pay

## 2023-01-07 MED ORDER — ATORVASTATIN CALCIUM 20 MG PO TABS
20.0000 mg | ORAL_TABLET | Freq: Every evening | ORAL | 0 refills | Status: DC
  Filled 2023-01-07: qty 30, 30d supply, fill #0

## 2023-01-09 ENCOUNTER — Other Ambulatory Visit (HOSPITAL_COMMUNITY): Payer: Self-pay

## 2023-02-17 ENCOUNTER — Other Ambulatory Visit: Payer: Self-pay

## 2023-02-17 ENCOUNTER — Other Ambulatory Visit (HOSPITAL_COMMUNITY): Payer: Self-pay

## 2023-02-17 MED ORDER — ATORVASTATIN CALCIUM 20 MG PO TABS
20.0000 mg | ORAL_TABLET | Freq: Every evening | ORAL | 0 refills | Status: DC
  Filled 2023-02-17: qty 30, 30d supply, fill #0

## 2023-03-16 ENCOUNTER — Other Ambulatory Visit (HOSPITAL_COMMUNITY): Payer: Self-pay

## 2023-03-18 ENCOUNTER — Other Ambulatory Visit (HOSPITAL_COMMUNITY): Payer: Self-pay

## 2023-03-19 ENCOUNTER — Other Ambulatory Visit (HOSPITAL_COMMUNITY): Payer: Self-pay

## 2023-03-19 MED ORDER — ATORVASTATIN CALCIUM 20 MG PO TABS
20.0000 mg | ORAL_TABLET | Freq: Every evening | ORAL | 0 refills | Status: DC
  Filled 2023-03-19: qty 30, 30d supply, fill #0

## 2023-03-20 ENCOUNTER — Other Ambulatory Visit (HOSPITAL_COMMUNITY): Payer: Self-pay

## 2023-03-20 ENCOUNTER — Other Ambulatory Visit: Payer: Self-pay

## 2023-04-16 ENCOUNTER — Other Ambulatory Visit (HOSPITAL_COMMUNITY): Payer: Self-pay

## 2023-04-16 MED ORDER — ATORVASTATIN CALCIUM 20 MG PO TABS
20.0000 mg | ORAL_TABLET | Freq: Every evening | ORAL | 0 refills | Status: DC
  Filled 2023-04-16: qty 30, 30d supply, fill #0

## 2023-04-17 ENCOUNTER — Other Ambulatory Visit: Payer: Self-pay

## 2023-04-17 ENCOUNTER — Other Ambulatory Visit (HOSPITAL_COMMUNITY): Payer: Self-pay

## 2023-05-12 ENCOUNTER — Other Ambulatory Visit (HOSPITAL_COMMUNITY): Payer: Self-pay

## 2023-05-24 ENCOUNTER — Other Ambulatory Visit (HOSPITAL_COMMUNITY): Payer: Self-pay

## 2023-05-31 ENCOUNTER — Other Ambulatory Visit (HOSPITAL_COMMUNITY): Payer: Self-pay

## 2023-06-02 ENCOUNTER — Other Ambulatory Visit (HOSPITAL_COMMUNITY): Payer: Self-pay

## 2023-06-26 ENCOUNTER — Other Ambulatory Visit (HOSPITAL_COMMUNITY): Payer: Self-pay

## 2023-06-27 ENCOUNTER — Other Ambulatory Visit (HOSPITAL_COMMUNITY): Payer: Self-pay

## 2023-06-27 ENCOUNTER — Other Ambulatory Visit: Payer: Self-pay

## 2023-06-27 MED ORDER — ATORVASTATIN CALCIUM 20 MG PO TABS
20.0000 mg | ORAL_TABLET | Freq: Every evening | ORAL | 0 refills | Status: DC
  Filled 2023-06-27: qty 30, 30d supply, fill #0

## 2023-07-12 ENCOUNTER — Other Ambulatory Visit (HOSPITAL_COMMUNITY): Payer: Self-pay

## 2023-07-12 DIAGNOSIS — R030 Elevated blood-pressure reading, without diagnosis of hypertension: Secondary | ICD-10-CM | POA: Diagnosis not present

## 2023-07-12 DIAGNOSIS — R635 Abnormal weight gain: Secondary | ICD-10-CM | POA: Diagnosis not present

## 2023-07-12 DIAGNOSIS — N951 Menopausal and female climacteric states: Secondary | ICD-10-CM | POA: Diagnosis not present

## 2023-07-12 DIAGNOSIS — K219 Gastro-esophageal reflux disease without esophagitis: Secondary | ICD-10-CM | POA: Diagnosis not present

## 2023-07-12 DIAGNOSIS — K5900 Constipation, unspecified: Secondary | ICD-10-CM | POA: Diagnosis not present

## 2023-07-12 DIAGNOSIS — E785 Hyperlipidemia, unspecified: Secondary | ICD-10-CM | POA: Diagnosis not present

## 2023-07-12 DIAGNOSIS — Z6830 Body mass index (BMI) 30.0-30.9, adult: Secondary | ICD-10-CM | POA: Diagnosis not present

## 2023-07-12 MED ORDER — LINZESS 72 MCG PO CAPS
72.0000 ug | ORAL_CAPSULE | Freq: Every day | ORAL | 0 refills | Status: DC
  Filled 2023-07-12: qty 90, 90d supply, fill #0

## 2023-07-12 MED ORDER — ATORVASTATIN CALCIUM 20 MG PO TABS
20.0000 mg | ORAL_TABLET | Freq: Every evening | ORAL | 0 refills | Status: DC
  Filled 2023-07-12 – 2023-07-29 (×2): qty 30, 30d supply, fill #0

## 2023-07-12 MED ORDER — PANTOPRAZOLE SODIUM 40 MG PO TBEC
40.0000 mg | DELAYED_RELEASE_TABLET | Freq: Every day | ORAL | 0 refills | Status: DC
  Filled 2023-07-12: qty 90, 90d supply, fill #0

## 2023-07-13 ENCOUNTER — Other Ambulatory Visit (HOSPITAL_COMMUNITY): Payer: Self-pay

## 2023-07-13 ENCOUNTER — Other Ambulatory Visit: Payer: Self-pay

## 2023-07-29 ENCOUNTER — Other Ambulatory Visit (HOSPITAL_COMMUNITY): Payer: Self-pay

## 2023-07-31 ENCOUNTER — Other Ambulatory Visit (HOSPITAL_COMMUNITY): Payer: Self-pay

## 2023-08-02 ENCOUNTER — Other Ambulatory Visit (HOSPITAL_COMMUNITY): Payer: Self-pay

## 2023-08-24 ENCOUNTER — Other Ambulatory Visit (HOSPITAL_COMMUNITY): Payer: Self-pay

## 2023-08-25 ENCOUNTER — Other Ambulatory Visit (HOSPITAL_COMMUNITY): Payer: Self-pay

## 2023-08-25 MED ORDER — ATORVASTATIN CALCIUM 20 MG PO TABS
20.0000 mg | ORAL_TABLET | Freq: Every evening | ORAL | 2 refills | Status: DC
  Filled 2023-08-25: qty 30, 30d supply, fill #0
  Filled 2023-09-29: qty 30, 30d supply, fill #1
  Filled 2023-10-25: qty 30, 30d supply, fill #2

## 2023-08-28 ENCOUNTER — Other Ambulatory Visit: Payer: Self-pay

## 2023-09-09 ENCOUNTER — Inpatient Hospital Stay (HOSPITAL_COMMUNITY)
Admission: EM | Admit: 2023-09-09 | Discharge: 2023-09-11 | DRG: 854 | Disposition: A | Discharge status: Home / Self Care | Attending: Internal Medicine | Admitting: Internal Medicine

## 2023-09-09 ENCOUNTER — Encounter (HOSPITAL_COMMUNITY): Payer: Self-pay

## 2023-09-09 ENCOUNTER — Emergency Department (HOSPITAL_COMMUNITY)

## 2023-09-09 ENCOUNTER — Other Ambulatory Visit: Payer: Self-pay

## 2023-09-09 DIAGNOSIS — N136 Pyonephrosis: Secondary | ICD-10-CM | POA: Diagnosis not present

## 2023-09-09 DIAGNOSIS — E66812 Obesity, class 2: Secondary | ICD-10-CM | POA: Diagnosis not present

## 2023-09-09 DIAGNOSIS — K2950 Unspecified chronic gastritis without bleeding: Secondary | ICD-10-CM | POA: Diagnosis not present

## 2023-09-09 DIAGNOSIS — A499 Bacterial infection, unspecified: Secondary | ICD-10-CM | POA: Diagnosis not present

## 2023-09-09 DIAGNOSIS — K219 Gastro-esophageal reflux disease without esophagitis: Secondary | ICD-10-CM | POA: Diagnosis present

## 2023-09-09 DIAGNOSIS — N201 Calculus of ureter: Secondary | ICD-10-CM | POA: Diagnosis not present

## 2023-09-09 DIAGNOSIS — Z8673 Personal history of transient ischemic attack (TIA), and cerebral infarction without residual deficits: Secondary | ICD-10-CM | POA: Diagnosis not present

## 2023-09-09 DIAGNOSIS — Z888 Allergy status to other drugs, medicaments and biological substances status: Secondary | ICD-10-CM

## 2023-09-09 DIAGNOSIS — Z9109 Other allergy status, other than to drugs and biological substances: Secondary | ICD-10-CM | POA: Diagnosis not present

## 2023-09-09 DIAGNOSIS — Z79899 Other long term (current) drug therapy: Secondary | ICD-10-CM

## 2023-09-09 DIAGNOSIS — N10 Acute tubulo-interstitial nephritis: Secondary | ICD-10-CM | POA: Diagnosis not present

## 2023-09-09 DIAGNOSIS — E782 Mixed hyperlipidemia: Secondary | ICD-10-CM | POA: Diagnosis not present

## 2023-09-09 DIAGNOSIS — N200 Calculus of kidney: Secondary | ICD-10-CM | POA: Diagnosis not present

## 2023-09-09 DIAGNOSIS — Z6837 Body mass index (BMI) 37.0-37.9, adult: Secondary | ICD-10-CM | POA: Diagnosis not present

## 2023-09-09 DIAGNOSIS — R000 Tachycardia, unspecified: Secondary | ICD-10-CM | POA: Diagnosis not present

## 2023-09-09 DIAGNOSIS — R112 Nausea with vomiting, unspecified: Secondary | ICD-10-CM | POA: Diagnosis not present

## 2023-09-09 DIAGNOSIS — D259 Leiomyoma of uterus, unspecified: Secondary | ICD-10-CM | POA: Diagnosis not present

## 2023-09-09 DIAGNOSIS — Z882 Allergy status to sulfonamides status: Secondary | ICD-10-CM | POA: Diagnosis not present

## 2023-09-09 DIAGNOSIS — K5900 Constipation, unspecified: Secondary | ICD-10-CM

## 2023-09-09 DIAGNOSIS — E876 Hypokalemia: Secondary | ICD-10-CM | POA: Diagnosis not present

## 2023-09-09 DIAGNOSIS — N1330 Unspecified hydronephrosis: Secondary | ICD-10-CM | POA: Diagnosis not present

## 2023-09-09 DIAGNOSIS — K429 Umbilical hernia without obstruction or gangrene: Secondary | ICD-10-CM | POA: Diagnosis not present

## 2023-09-09 DIAGNOSIS — N138 Other obstructive and reflux uropathy: Secondary | ICD-10-CM | POA: Diagnosis not present

## 2023-09-09 DIAGNOSIS — N132 Hydronephrosis with renal and ureteral calculous obstruction: Secondary | ICD-10-CM | POA: Diagnosis not present

## 2023-09-09 DIAGNOSIS — A419 Sepsis, unspecified organism: Principal | ICD-10-CM

## 2023-09-09 DIAGNOSIS — N390 Urinary tract infection, site not specified: Secondary | ICD-10-CM | POA: Diagnosis not present

## 2023-09-09 DIAGNOSIS — R6520 Severe sepsis without septic shock: Secondary | ICD-10-CM | POA: Diagnosis not present

## 2023-09-09 DIAGNOSIS — K5730 Diverticulosis of large intestine without perforation or abscess without bleeding: Secondary | ICD-10-CM | POA: Diagnosis not present

## 2023-09-09 LAB — CBC
HCT: 37.7 % (ref 36.0–46.0)
Hemoglobin: 12.2 g/dL (ref 12.0–15.0)
MCH: 26.7 pg (ref 26.0–34.0)
MCHC: 32.4 g/dL (ref 30.0–36.0)
MCV: 82.5 fL (ref 80.0–100.0)
Platelets: 401 10*3/uL — ABNORMAL HIGH (ref 150–400)
RBC: 4.57 MIL/uL (ref 3.87–5.11)
RDW: 14.6 % (ref 11.5–15.5)
WBC: 13 10*3/uL — ABNORMAL HIGH (ref 4.0–10.5)
nRBC: 0 % (ref 0.0–0.2)

## 2023-09-09 LAB — LACTIC ACID, PLASMA: Lactic Acid, Venous: 0.8 mmol/L (ref 0.5–1.9)

## 2023-09-09 LAB — COMPREHENSIVE METABOLIC PANEL
ALT: 22 U/L (ref 0–44)
AST: 20 U/L (ref 15–41)
Albumin: 3.6 g/dL (ref 3.5–5.0)
Alkaline Phosphatase: 92 U/L (ref 38–126)
Anion gap: 13 (ref 5–15)
BUN: 11 mg/dL (ref 6–20)
CO2: 25 mmol/L (ref 22–32)
Calcium: 9 mg/dL (ref 8.9–10.3)
Chloride: 98 mmol/L (ref 98–111)
Creatinine, Ser: 0.91 mg/dL (ref 0.44–1.00)
GFR, Estimated: >60 mL/min (ref >60)
Glucose, Bld: 151 mg/dL — ABNORMAL HIGH (ref 70–99)
Potassium: 2.6 mmol/L — CL (ref 3.5–5.1)
Sodium: 136 mmol/L (ref 135–145)
Total Bilirubin: 1.5 mg/dL — ABNORMAL HIGH (ref 0.0–1.2)
Total Protein: 8 g/dL (ref 6.5–8.1)

## 2023-09-09 LAB — LIPASE, BLOOD: Lipase: 29 U/L (ref 11–51)

## 2023-09-09 LAB — URINALYSIS, ROUTINE W REFLEX MICROSCOPIC
Bilirubin Urine: NEGATIVE
Glucose, UA: NEGATIVE mg/dL
Ketones, ur: 5 mg/dL — AB
Nitrite: POSITIVE — AB
Protein, ur: 100 mg/dL — AB
Specific Gravity, Urine: 1.014 (ref 1.005–1.030)
WBC, UA: >50 WBC/hpf (ref 0–5)
pH: 5 (ref 5.0–8.0)

## 2023-09-09 LAB — POC URINE PREG, ED: Preg Test, Ur: NEGATIVE

## 2023-09-09 MED ORDER — ONDANSETRON HCL 4 MG/2ML IJ SOLN
4.0000 mg | Freq: Once | INTRAMUSCULAR | Status: AC
  Administered 2023-09-09: 4 mg via INTRAVENOUS
  Filled 2023-09-09: qty 2

## 2023-09-09 MED ORDER — IOHEXOL 300 MG/ML  SOLN
100.0000 mL | Freq: Once | INTRAMUSCULAR | Status: AC | PRN
  Administered 2023-09-09: 100 mL via INTRAVENOUS

## 2023-09-09 MED ORDER — ATORVASTATIN CALCIUM 20 MG PO TABS
20.0000 mg | ORAL_TABLET | Freq: Every evening | ORAL | Status: DC
  Administered 2023-09-10: 20 mg via ORAL
  Filled 2023-09-09: qty 1

## 2023-09-09 MED ORDER — ONDANSETRON HCL 4 MG/2ML IJ SOLN
4.0000 mg | Freq: Four times a day (QID) | INTRAMUSCULAR | Status: DC | PRN
  Administered 2023-09-10 (×2): 4 mg via INTRAVENOUS
  Filled 2023-09-09: qty 2

## 2023-09-09 MED ORDER — POTASSIUM CHLORIDE 10 MEQ/100ML IV SOLN
10.0000 meq | Freq: Once | INTRAVENOUS | Status: AC
  Administered 2023-09-09: 10 meq via INTRAVENOUS
  Filled 2023-09-09: qty 100

## 2023-09-09 MED ORDER — ACETAMINOPHEN 650 MG RE SUPP: 650.0000 mg | Freq: Four times a day (QID) | RECTAL | Status: DC | PRN

## 2023-09-09 MED ORDER — SODIUM CHLORIDE 0.9 % IV SOLN
1.0000 g | Freq: Once | INTRAVENOUS | Status: AC
  Administered 2023-09-09: 1 g via INTRAVENOUS
  Filled 2023-09-09: qty 10

## 2023-09-09 MED ORDER — ACETAMINOPHEN 325 MG PO TABS: 650.0000 mg | ORAL_TABLET | Freq: Four times a day (QID) | ORAL | Status: DC | PRN

## 2023-09-09 MED ORDER — FENTANYL CITRATE PF 50 MCG/ML IJ SOSY
50.0000 ug | PREFILLED_SYRINGE | Freq: Once | INTRAMUSCULAR | Status: AC
  Administered 2023-09-09: 50 ug via INTRAVENOUS
  Filled 2023-09-09: qty 1

## 2023-09-09 MED ORDER — SODIUM CHLORIDE 0.9 % IV SOLN: 1.0000 g | INTRAVENOUS | Status: DC

## 2023-09-09 MED ORDER — SODIUM CHLORIDE 0.9 % IV SOLN: INTRAVENOUS | Status: DC

## 2023-09-09 MED ORDER — POTASSIUM CHLORIDE CRYS ER 20 MEQ PO TBCR
40.0000 meq | EXTENDED_RELEASE_TABLET | Freq: Once | ORAL | Status: AC
  Administered 2023-09-09: 40 meq via ORAL
  Filled 2023-09-09: qty 2

## 2023-09-09 MED ORDER — ONDANSETRON HCL 4 MG PO TABS
4.0000 mg | ORAL_TABLET | Freq: Four times a day (QID) | ORAL | Status: DC | PRN
Start: 2023-09-09 — End: 2023-09-11

## 2023-09-09 MED ORDER — PANTOPRAZOLE SODIUM 40 MG IV SOLR
40.0000 mg | INTRAVENOUS | Status: DC
  Administered 2023-09-09 – 2023-09-10 (×2): 40 mg via INTRAVENOUS
  Filled 2023-09-09 (×2): qty 10

## 2023-09-09 MED ORDER — LINACLOTIDE 72 MCG PO CAPS
72.0000 ug | ORAL_CAPSULE | Freq: Every day | ORAL | Status: DC
  Filled 2023-09-09 (×4): qty 1

## 2023-09-09 MED ORDER — MAGNESIUM SULFATE 2 GM/50ML IV SOLN
2.0000 g | INTRAVENOUS | Status: AC
Start: 2023-09-09 — End: 2023-09-09
  Administered 2023-09-09: 2 g via INTRAVENOUS
  Filled 2023-09-09: qty 50

## 2023-09-09 MED ORDER — HYDROMORPHONE HCL 1 MG/ML IJ SOLN
1.0000 mg | Freq: Once | INTRAMUSCULAR | Status: AC
  Administered 2023-09-09: 1 mg via INTRAVENOUS
  Filled 2023-09-09: qty 1

## 2023-09-09 NOTE — ED Notes (Signed)
 Patient transported to CT

## 2023-09-09 NOTE — ED Notes (Signed)
 Provider at bedside

## 2023-09-09 NOTE — ED Triage Notes (Signed)
 Pt stated that she has been having abd pain since Wed that radiates to her right side. Pt stated that it got worse to the point of her having no appetite. Pt also complaining of fever and headache. Heart rate 159, BP 165/106 in triage

## 2023-09-09 NOTE — ED Provider Notes (Signed)
 [facility] Provider Note   CSN: [id number] Arrival date & time: 09/09/23  1845     History  Chief Complaint  Patient presents with   Abdominal Pain   Back Pain    Darlene Vazquez is a 38 y.o. female.   Abdominal Pain Back Pain Associated symptoms: abdominal pain    This patient is a very pleasant 38 year old female, she has a history of high cholesterol on atorvastatin, she has a history of chronic gastritis and chronic constipation, she takes pantoprazole and occasionally Linzess.  She presents with a complaint of abdominal pain which has been going on for several days mostly in the right upper quadrant, persistent gradually worsening without radiation.  She has been nauseated, it does not radiate to her shoulder but does radiate to the right flank.  No dysuria no swelling, no prior history of cholecystitis though she does report a history of being told she had gallstones, kidney stones and had prior tubal rupture with an ectopic pregnancy.    Home Medications Prior to Admission medications   Medication Sig Start Date End Date Taking? Authorizing Provider  atorvastatin (LIPITOR) 20 MG tablet Take 1 tablet (20 mg total) by mouth every evening. 08/25/23     HYDROcodone-acetaminophen (NORCO/VICODIN) 5-325 MG tablet Take 1 tablet by mouth every 4 (four) hours as needed. Patient not taking: Reported on 01/05/2019 09/12/18   Raeford Razor, MD  ibuprofen (ADVIL) 200 MG tablet Take 200 mg by mouth daily as needed for mild pain or moderate pain.    [provider]  linaclotide (LINZESS) 72 MCG capsule Take 1 capsule (72 mcg total) by mouth daily. 07/12/23     naloxegol oxalate (MOVANTIK) 12.5 MG TABS tablet Take 1 tablet (12.5 mg total) by mouth daily. Patient not taking: Reported on 01/05/2019 09/23/18   West Bali, MD  naproxen (NAPROSYN) 500 MG tablet Take 1 tablet (500 mg total) by mouth 2 (two) times daily. 01/05/19   Bethann Berkshire, MD   pantoprazole (PROTONIX) 40 MG tablet TAKE ONE (1) TABLET BY MOUTH EVERY DAY 08/20/19   Tiffany Kocher, PA-C  pantoprazole (PROTONIX) 40 MG tablet Take 1 tablet (40 mg total) by mouth daily. 07/12/23     Prucalopride Succinate (MOTEGRITY) 2 MG TABS Take 2 mg by mouth daily. Patient not taking: Reported on 01/05/2019 09/12/18   West Bali, MD      Allergies    Linzess [linaclotide] and Other    Review of Systems   Review of Systems  Gastrointestinal:  Positive for abdominal pain.  Musculoskeletal:  Positive for back pain.  All other systems reviewed and are negative.   Physical Exam Updated Vital Signs BP 130/88   Pulse (!) 121   Temp 99.5 F (37.5 C) (Oral)   Resp 19   Ht 1.575 m (5\' 2" )   Wt 93 kg   SpO2 96%   BMI 37.49 kg/m  Physical Exam Vitals and nursing note reviewed.  Constitutional:      General: She is not in acute distress.    Appearance: She is well-developed.  HENT:     Head: Normocephalic and atraumatic.     Mouth/Throat:     Pharynx: No oropharyngeal exudate.  Eyes:     General: No scleral icterus.       Right eye: No discharge.        Left eye: No discharge.     Conjunctiva/sclera: Conjunctivae normal.  Pupils: Pupils are equal, round, and reactive to light.  Neck:     Thyroid: No thyromegaly.     Vascular: No JVD.  Cardiovascular:     Rate and Rhythm: Regular rhythm. Tachycardia present.     Heart sounds: Normal heart sounds. No murmur heard.    No friction rub. No gallop.  Pulmonary:     Effort: Pulmonary effort is normal. No respiratory distress.     Breath sounds: Normal breath sounds. No wheezing or rales.  Abdominal:     General: Bowel sounds are normal. There is no distension.     Palpations: Abdomen is soft. There is no mass.     Tenderness: There is abdominal tenderness.  Musculoskeletal:        General: No tenderness. Normal range of motion.     Cervical back: Normal range of motion and neck supple.     Right lower leg: No  edema.     Left lower leg: No edema.  Lymphadenopathy:     Cervical: No cervical adenopathy.  Skin:    General: Skin is warm and dry.     Findings: No erythema or rash.  Neurological:     Mental Status: She is alert.     Coordination: Coordination normal.  Psychiatric:        Behavior: Behavior normal.     ED Results / Procedures / Treatments   Labs (all labs ordered are listed, but only abnormal results are displayed) Labs Reviewed  COMPREHENSIVE METABOLIC PANEL - Abnormal; Notable for the following components:      Result Value   Potassium 2.6 (*)    Glucose, Bld 151 (*)    Total Bilirubin 1.5 (*)    All other components within normal limits  CBC - Abnormal; Notable for the following components:   WBC 13.0 (*)    Platelets 401 (*)    All other components within normal limits  URINALYSIS, ROUTINE W REFLEX MICROSCOPIC - Abnormal; Notable for the following components:   APPearance CLOUDY (*)    Hgb urine dipstick MODERATE (*)    Ketones, ur 5 (*)    Protein, ur 100 (*)    Nitrite POSITIVE (*)    Leukocytes,Ua LARGE (*)    Bacteria, UA MANY (*)    All other components within normal limits  LIPASE, BLOOD  LACTIC ACID, PLASMA  POC URINE PREG, ED    EKG EKG Interpretation Date/Time:  Saturday September 09 2023 19:01:20 EST Ventricular Rate:  158 PR Interval:  104 QRS Duration:  80 QT Interval:  308 QTC Calculation: 499 R Axis:   -4  Text Interpretation: Sinus tachycardia with short PR ST & T wave abnormality, consider inferior ischemia Abnormal ECG When compared with ECG of 05-Jan-2019 17:40, Nonspecific T wave abnormality now evident in Lateral leads Since last tracing in 2020, rate slightly faster Confirmed by Eber Hong ([id number]) on 09/09/2023 7:37:12 PM  Radiology CT ABDOMEN PELVIS W CONTRAST Result Date: 09/09/2023 CLINICAL DATA:  Provided history: Biliary obstruction suspected (Ped 0-17y) ruq pain, chole? EXAM: CT ABDOMEN AND PELVIS WITH CONTRAST TECHNIQUE:  Multidetector CT imaging of the abdomen and pelvis was performed using the standard protocol following bolus administration of intravenous contrast. RADIATION DOSE REDUCTION: This exam was performed according to the departmental dose-optimization program which includes automated exposure control, adjustment of the mA and/or kV according to patient size and/or use of iterative reconstruction technique. CONTRAST:  OMNIPAQUE IOHEXOL 300 MG/ML  SOLN COMPARISON:  CT 09/12/2018 FINDINGS: Lower  chest: Subsegmental atelectasis in the right lower lobe. No pleural effusion. Hepatobiliary: No focal liver abnormality is seen. No gallstones, gallbladder wall thickening, or biliary dilatation. Pancreas: No ductal dilatation or inflammation. Spleen: Normal in size without focal abnormality. Adrenals/Urinary Tract: No adrenal nodule. Obstructing 6 x 7 mm stone in the distal right ureter (at the level of the upper sacrum) with moderate proximal hydroureteronephrosis. There is mild ureteral enhancement with perinephric and ureteric fat stranding the left kidney is unremarkable. Unremarkable urinary bladder. Stomach/Bowel: Colonic diverticulosis without diverticulitis. No bowel obstruction or inflammation. The appendix is normal. Vascular/Lymphatic: No acute vascular findings. Normal caliber abdominal aorta. Portal vein is patent. No abdominopelvic adenopathy. Reproductive: Lobulated uterus with multiple fibroids. Other: No free air or ascites. Moderate fat containing umbilical hernia, no bowel involvement. Musculoskeletal: There are no acute or suspicious osseous abnormalities. IMPRESSION: 1. Obstructing 6 x 7 mm stone in the distal right ureter with moderate proximal hydroureteronephrosis. 2. Colonic diverticulosis without diverticulitis. 3. Fibroid uterus. 4. Moderate fat containing umbilical hernia. Electronically Signed   By: Narda Rutherford M.D.   On: 09/09/2023 21:41    Procedures .Critical Care  Performed by:  Eber Hong, MD Authorized by: Eber Hong, MD   Critical care provider statement:    Critical care time (minutes):  45   Critical care time was exclusive of:  Separately billable procedures and treating other patients   Critical care was necessary to treat or prevent imminent or life-threatening deterioration of the following conditions:  Sepsis   Critical care was time spent personally by me on the following activities:  Development of treatment plan with patient or surrogate, discussions with consultants, evaluation of patient's response to treatment, examination of patient, obtaining history from patient or surrogate, review of old charts, re-evaluation of patient's condition, pulse oximetry, ordering and review of radiographic studies, ordering and review of laboratory studies and ordering and performing treatments and interventions   I assumed direction of critical care for this patient from another provider in my specialty: no     Care discussed with: admitting provider   Comments:           Medications Ordered in ED Medications  0.9 %  sodium chloride infusion ( Intravenous New Bag/Given 09/09/23 1930)  potassium chloride 10 mEq in 100 mL IVPB (10 mEq Intravenous New Bag/Given 09/09/23 2112)  magnesium sulfate IVPB 2 g 50 mL (2 g Intravenous New Bag/Given 09/09/23 2111)  cefTRIAXone (ROCEPHIN) 1 g in sodium chloride 0.9 % 100 mL IVPB (has no administration in time range)  fentaNYL (SUBLIMAZE) injection 50 mcg (has no administration in time range)  HYDROmorphone (DILAUDID) injection 1 mg (1 mg Intravenous Given 09/09/23 1928)  ondansetron (ZOFRAN) injection 4 mg (4 mg Intravenous Given 09/09/23 1928)  iohexol (OMNIPAQUE) 300 MG/ML solution 100 mL (100 mLs Intravenous Contrast Given 09/09/23 2120)    ED Course/ Medical Decision Making/ A&P                                 Medical Decision Making Amount and/or Complexity of Data Reviewed Labs: ordered. Radiology:  ordered.  Risk Prescription drug management. Decision regarding hospitalization.    This patient presents to the ED for concern of right upper quadrant abdominal pain with radiation to the right back consistent with possible cholecystitis, this involves an extensive number of treatment options, and is a complaint that carries with it a high risk of complications and morbidity.  The differential diagnosis includes cholecystitis, bowel obstruction, pancreatitis, severe gastritis, perforation, sepsis   Co morbidities that complicate the patient evaluation  Overweight   Additional history obtained:  Additional history obtained from medical record External records from outside source obtained and reviewed including multiple prior ED visits for minor complaints, history of possible ventral hernia several years ago, history of ovarian cyst several years ago, admission to the hospital for EGD back in 2016, known history of gallbladder polyp that was diagnosed in 2015, admission for ruptured ectopic in 2015.   Lab Tests:  I Ordered, and personally interpreted labs.  The pertinent results include: Leukocytosis of 13,000, renal function is preserved, urinalysis consistent with UTI with many bacteria greater than 50 white blood cells positive nitrites and large leukocytes.  There is normal renal function, there is hypokalemia, potassium was repleted.   Imaging Studies ordered:  I ordered imaging studies including CT scan of the abdomen and pelvis I independently visualized and interpreted imaging which showed obstructing stone in the distal ureter I agree with the radiologist interpretation   Cardiac Monitoring: / EKG:  The patient was maintained on a cardiac monitor.  I personally viewed and interpreted the cardiac monitored which showed an underlying rhythm of: Sinus tachycardia   Consultations Obtained:  I requested consultation with the urologist Dr. Charlton Haws and the hospitalist,   and discussed lab and imaging findings as well as pertinent plan - they recommend: Transfer to [facility], will need stenting in the morning   Problem List / ED Course / Critical interventions / Medication management  The patient actually appear septic with tachycardia leukocytosis and a borderline fever.  She has a source of infection in the urine with a large obstructing stone.  She will be given antibiotics IV fluids pain medicines including parenteral analgesia as well as antinausea medications. I ordered medication including Rocephin, opiates, Zofran, IV fluids for sepsis Reevaluation of the patient after these medicines showed that the patient slight improvement I have reviewed the patients home medicines and have made adjustments as needed   Social Determinants of Health:  None   Test / Admission - Considered:  High level of care         Final Clinical Impression(s) / ED Diagnoses Final diagnoses:  Sepsis, due to unspecified organism, unspecified whether acute organ dysfunction present [facility])  Urinary tract obstruction by kidney stone    Rx / DC Orders ED Discharge Orders     None         Eber Hong, MD 09/09/23 2206

## 2023-09-09 NOTE — H&P (Signed)
 History and Physical    Patient: Darlene Vazquez:[id number] DOB: 1985/12/09 DOA: 09/09/2023 DOS: the patient was seen and examined on 09/09/2023 PCP: Royann Shivers, PA-C  Patient coming from: Home  Chief Complaint:  Chief Complaint  Patient presents with   Abdominal Pain   Back Pain   HPI: Darlene Vazquez is a 38 y.o. female with medical history significant of hyperlipidemia, chronic gastritis and chronic constipation who presents to the emergency department due to several days of onset of right upper quadrant pain with radiation to right flank associated with nausea and NBNB vomiting.  She endorses fever of 102.76F today at home and complaining of decreased appetite and headache.  Pain was rated as 10/10 on pain scale, it was exacerbated with movement.  ED Course:  In the emergency department, she was tachypneic, tachycardic, BP was 165/106.  Temperature 99.5 F, O2 sat was 96% on room air.  Workup in the ED showed normal CBC except for WBC of 13.0 and platelets of 401.  BMP was normal except for potassium of 2.6 blood glucose of 151.  Lipase 29, pregnancy test was negative, urinalysis was positive for UTI. CT abdomen and pelvis with contrast showed obstructing 6 x 7 mm stone in the distal right ureter with moderate proximal hydroureteronephrosis. Urologist on-call (Dr. Charlton Haws) was consulted and recommended transferring patient to [facility] with plan to take the patient to the OR in the morning for stenting. Hospitalist was asked to admit patient for further evaluation and management.  Review of Systems: Review of systems as noted in the HPI. All other systems reviewed and are negative.   Past Medical History:  Diagnosis Date   GERD (gastroesophageal reflux disease)    Past Surgical History:  Procedure Laterality Date   DIAGNOSTIC LAPAROSCOPY WITH REMOVAL OF ECTOPIC PREGNANCY Right 11/09/2013   Procedure: DIAGNOSTIC LAPAROSCOPY WITH REMOVAL OF ECTOPIC PREGNANCY;  Surgeon:  Lazaro Arms, MD;  Location: [facility];  Service: Gynecology;  Laterality: Right;   ESOPHAGOGASTRODUODENOSCOPY N/A 08/12/2014   SLF: 1. No obvious source for epigastric pain identified. 2. Mild gastiritis.    LAPAROSCOPY N/A 11/09/2013   Procedure: LAPAROSCOPY OPERATIVE;  Surgeon: Lazaro Arms, MD;  Location: [facility];  Service: Gynecology;  Laterality: N/A;   UNILATERAL SALPINGECTOMY Right 11/09/2013   Procedure: UNILATERAL SALPINGECTOMY;  Surgeon: Lazaro Arms, MD;  Location: [facility];  Service: Gynecology;  Laterality: Right;    Social History:  reports that she has never smoked. She has never used smokeless tobacco. She reports that she does not drink alcohol and does not use drugs.   Allergies  Allergen Reactions   Linzess [Linaclotide]     ABDOMINAL CRAMPS   Other     Hair dye    Family History  Problem Relation Age of Onset   Wilson's disease Maternal Grandfather    Colon cancer Neg Hx    Colon polyps Neg Hx      Prior to Admission medications   Medication Sig Start Date End Date Taking? Authorizing Provider  atorvastatin (LIPITOR) 20 MG tablet Take 1 tablet (20 mg total) by mouth every evening. 08/25/23     HYDROcodone-acetaminophen (NORCO/VICODIN) 5-325 MG tablet Take 1 tablet by mouth every 4 (four) hours as needed. Patient not taking: Reported on 01/05/2019 09/12/18   Raeford Razor, MD  ibuprofen (ADVIL) 200 MG tablet Take 200 mg by mouth daily as needed for mild pain or moderate pain.    [provider]  linaclotide Karlene Einstein)  72 MCG capsule Take 1 capsule (72 mcg total) by mouth daily. 07/12/23     naloxegol oxalate (MOVANTIK) 12.5 MG TABS tablet Take 1 tablet (12.5 mg total) by mouth daily. Patient not taking: Reported on 01/05/2019 09/23/18   [place] Bali, MD  naproxen (NAPROSYN) 500 MG tablet Take 1 tablet (500 mg total) by mouth 2 (two) times daily. 01/05/19   Bethann Berkshire, MD  pantoprazole (PROTONIX) 40 MG tablet TAKE ONE (1) TABLET BY MOUTH EVERY DAY 08/20/19   Tiffany Kocher, PA-C  pantoprazole (PROTONIX) 40 MG tablet Take 1 tablet (40 mg total) by mouth daily. 07/12/23     Prucalopride Succinate (MOTEGRITY) 2 MG TABS Take 2 mg by mouth daily. Patient not taking: Reported on 01/05/2019 09/12/18   [place] Bali, MD    Physical Exam: BP 130/88   Pulse (!) 121   Temp 99.5 F (37.5 C) (Oral)   Resp 19   Ht 5\' 2"  (1.575 m)   Wt 93 kg   SpO2 96%   BMI 37.49 kg/m   General: 38 y.o. year-old female well developed well nourished in no acute distress.  Alert and oriented x3. HEENT: NCAT, EOMI Neck: Supple, trachea medial Cardiovascular: Tachycardia.  Regular rate and rhythm with no rubs or gallops.  No thyromegaly or JVD noted.  No lower extremity edema. 2/4 pulses in all 4 extremities. Respiratory: Clear to auscultation with no wheezes or rales. Good inspiratory effort. Abdomen: Soft, nontender nondistended with normal bowel sounds x4 quadrants. Muskuloskeletal: No cyanosis, clubbing or edema noted bilaterally Neuro: CN II-XII intact, strength 5/5 x 4, sensation, reflexes intact Skin: No ulcerative lesions noted or rashes Psychiatry: Judgement and insight appear normal. Mood is appropriate for condition and setting          Labs on Admission:  Basic Metabolic Panel: Recent Labs  Lab 09/09/23 1859  NA 136  K 2.6*  CL 98  CO2 25  GLUCOSE 151*  BUN 11  CREATININE 0.91  CALCIUM 9.0   Liver Function Tests: Recent Labs  Lab 09/09/23 1859  AST 20  ALT 22  ALKPHOS 92  BILITOT 1.5*  PROT 8.0  ALBUMIN 3.6   Recent Labs  Lab 09/09/23 1859  LIPASE 29   No results for input(s): "AMMONIA" in the last 168 hours. CBC: Recent Labs  Lab 09/09/23 1859  WBC 13.0*  HGB 12.2  HCT 37.7  MCV 82.5  PLT 401*   Cardiac Enzymes: No results for input(s): "CKTOTAL", "CKMB", "CKMBINDEX", "TROPONINI" in the last 168 hours.  BNP (last 3 results) No results for input(s): "BNP" in the last 8760 hours.  ProBNP (last 3 results) No results for  input(s): "PROBNP" in the last 8760 hours.  CBG: No results for input(s): "GLUCAP" in the last 168 hours.  Radiological Exams on Admission: CT ABDOMEN PELVIS W CONTRAST Result Date: 09/09/2023 CLINICAL DATA:  Provided history: Biliary obstruction suspected (Ped 0-17y) ruq pain, chole? EXAM: CT ABDOMEN AND PELVIS WITH CONTRAST TECHNIQUE: Multidetector CT imaging of the abdomen and pelvis was performed using the standard protocol following bolus administration of intravenous contrast. RADIATION DOSE REDUCTION: This exam was performed according to the departmental dose-optimization program which includes automated exposure control, adjustment of the mA and/or kV according to patient size and/or use of iterative reconstruction technique. CONTRAST:  OMNIPAQUE IOHEXOL 300 MG/ML  SOLN COMPARISON:  CT 09/12/2018 FINDINGS: Lower chest: Subsegmental atelectasis in the right lower lobe. No pleural effusion. Hepatobiliary: No focal liver abnormality is seen.  No gallstones, gallbladder wall thickening, or biliary dilatation. Pancreas: No ductal dilatation or inflammation. Spleen: Normal in size without focal abnormality. Adrenals/Urinary Tract: No adrenal nodule. Obstructing 6 x 7 mm stone in the distal right ureter (at the level of the upper sacrum) with moderate proximal hydroureteronephrosis. There is mild ureteral enhancement with perinephric and ureteric fat stranding the left kidney is unremarkable. Unremarkable urinary bladder. Stomach/Bowel: Colonic diverticulosis without diverticulitis. No bowel obstruction or inflammation. The appendix is normal. Vascular/Lymphatic: No acute vascular findings. Normal caliber abdominal aorta. Portal vein is patent. No abdominopelvic adenopathy. Reproductive: Lobulated uterus with multiple fibroids. Other: No free air or ascites. Moderate fat containing umbilical hernia, no bowel involvement. Musculoskeletal: There are no acute or suspicious osseous abnormalities. IMPRESSION:  1. Obstructing 6 x 7 mm stone in the distal right ureter with moderate proximal hydroureteronephrosis. 2. Colonic diverticulosis without diverticulitis. 3. Fibroid uterus. 4. Moderate fat containing umbilical hernia. Electronically Signed   By: Narda Rutherford M.D.   On: 09/09/2023 21:41    EKG: I independently viewed the EKG done and my findings are as followed: Sinus tachycardia at a rate of 158 bpm with short PR interval  Assessment/Plan Present on Admission:  Sepsis, due to unspecified organism, unspecified whether acute organ dysfunction present [facility])  Constipation  Principal Problem:   Sepsis, due to unspecified organism, unspecified whether acute organ dysfunction present [facility]) Active Problems:   Constipation   Urinary tract obstruction by kidney stone   Hypokalemia   Mixed hyperlipidemia   Chronic gastritis  Sepsis possibly secondary to presumed pyelonephritis Urinary tract obstruction by kidney stone Patient met sepsis criteria due to leukocytosis, tachycardia, tachypnea on arrival to the ED and with source of infection which is UTI due to urinary tract obstruction Patient was started on IV ceftriaxone, we shall continue same at this time Continue Tylenol as needed Patient will be placed n.p.o. at midnight in anticipation for possible urological intervention in the morning Blood culture pending Urologist (Dr. Charlton Haws) was consulted and recommended admitting patient to [facility] with plan to take patient to OR in the morning.  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 Charlton Haws) by [facility] ED[facility]  Severity of  Illness: The appropriate patient status for this patient is INPATIENT. Inpatient status is judged to be reasonable and necessary in order to provide the required intensity of service to ensure the patient's safety. The patient's presenting symptoms, physical exam findings, and initial radiographic and laboratory data in the context of their chronic comorbidities is felt to place them at high risk for further clinical deterioration. Furthermore, it is not anticipated that the patient will be medically stable for discharge from the hospital within 2 midnights of admission.   * I certify that at the point of admission it is my clinical judgment that the patient will require inpatient hospital care spanning beyond 2 midnights from the point of admission due to high intensity of service, high risk for further deterioration and high frequency of surveillance required.*  Author: Frankey Shown, DO 09/09/2023 10:50 PM  For on call review [email or web address].

## 2023-09-10 ENCOUNTER — Encounter (HOSPITAL_COMMUNITY)
Admission: EM | Disposition: A | Discharge status: Home / Self Care | Payer: Self-pay | Source: Home / Self Care | Attending: Internal Medicine

## 2023-09-10 ENCOUNTER — Inpatient Hospital Stay (HOSPITAL_COMMUNITY): Admitting: Anesthesiology

## 2023-09-10 ENCOUNTER — Inpatient Hospital Stay (HOSPITAL_COMMUNITY)

## 2023-09-10 DIAGNOSIS — N1330 Unspecified hydronephrosis: Secondary | ICD-10-CM

## 2023-09-10 DIAGNOSIS — K219 Gastro-esophageal reflux disease without esophagitis: Secondary | ICD-10-CM | POA: Diagnosis not present

## 2023-09-10 DIAGNOSIS — N200 Calculus of kidney: Secondary | ICD-10-CM | POA: Diagnosis not present

## 2023-09-10 DIAGNOSIS — A419 Sepsis, unspecified organism: Secondary | ICD-10-CM | POA: Diagnosis not present

## 2023-09-10 DIAGNOSIS — E876 Hypokalemia: Secondary | ICD-10-CM | POA: Diagnosis not present

## 2023-09-10 HISTORY — PX: CYSTOSCOPY W/ URETERAL STENT PLACEMENT: SHX1429

## 2023-09-10 LAB — CBC WITH DIFFERENTIAL/PLATELET
Abs Immature Granulocytes: 0.05 10*3/uL (ref 0.00–0.07)
Basophils Absolute: 0 10*3/uL (ref 0.0–0.1)
Basophils Relative: 0 %
Eosinophils Absolute: 0.1 10*3/uL (ref 0.0–0.5)
Eosinophils Relative: 1 %
HCT: 35.9 % — ABNORMAL LOW (ref 36.0–46.0)
Hemoglobin: 11.2 g/dL — ABNORMAL LOW (ref 12.0–15.0)
Immature Granulocytes: 1 %
Lymphocytes Relative: 12 %
Lymphs Abs: 1.1 10*3/uL (ref 0.7–4.0)
MCH: 27.1 pg (ref 26.0–34.0)
MCHC: 31.2 g/dL (ref 30.0–36.0)
MCV: 86.7 fL (ref 80.0–100.0)
Monocytes Absolute: 0.5 10*3/uL (ref 0.1–1.0)
Monocytes Relative: 5 %
Neutro Abs: 7.6 10*3/uL (ref 1.7–7.7)
Neutrophils Relative %: 81 %
Platelets: 375 10*3/uL (ref 150–400)
RBC: 4.14 MIL/uL (ref 3.87–5.11)
RDW: 14.7 % (ref 11.5–15.5)
WBC: 9.3 10*3/uL (ref 4.0–10.5)
nRBC: 0 % (ref 0.0–0.2)

## 2023-09-10 LAB — MAGNESIUM: Magnesium: 2.8 mg/dL — ABNORMAL HIGH (ref 1.7–2.4)

## 2023-09-10 LAB — PHOSPHORUS: Phosphorus: 2.6 mg/dL (ref 2.5–4.6)

## 2023-09-10 LAB — HIV ANTIBODY (ROUTINE TESTING W REFLEX): HIV Screen 4th Generation wRfx: NONREACTIVE

## 2023-09-10 LAB — COMPREHENSIVE METABOLIC PANEL
ALT: 21 U/L (ref 0–44)
AST: 18 U/L (ref 15–41)
Albumin: 3.3 g/dL — ABNORMAL LOW (ref 3.5–5.0)
Alkaline Phosphatase: 80 U/L (ref 38–126)
Anion gap: 13 (ref 5–15)
BUN: 9 mg/dL (ref 6–20)
CO2: 22 mmol/L (ref 22–32)
Calcium: 8.7 mg/dL — ABNORMAL LOW (ref 8.9–10.3)
Chloride: 103 mmol/L (ref 98–111)
Creatinine, Ser: 0.83 mg/dL (ref 0.44–1.00)
GFR, Estimated: >60 mL/min (ref >60)
Glucose, Bld: 149 mg/dL — ABNORMAL HIGH (ref 70–99)
Potassium: 3.6 mmol/L (ref 3.5–5.1)
Sodium: 138 mmol/L (ref 135–145)
Total Bilirubin: 0.7 mg/dL (ref 0.0–1.2)
Total Protein: 7.3 g/dL (ref 6.5–8.1)

## 2023-09-10 SURGERY — CYSTOSCOPY, WITH RETROGRADE PYELOGRAM AND URETERAL STENT INSERTION
Anesthesia: General | Site: Ureter | Laterality: Right

## 2023-09-10 MED ORDER — PROPOFOL 10 MG/ML IV BOLUS
INTRAVENOUS | Status: DC | PRN
  Administered 2023-09-10: 180 mg via INTRAVENOUS

## 2023-09-10 MED ORDER — ACETAMINOPHEN 10 MG/ML IV SOLN
INTRAVENOUS | Status: DC | PRN
  Administered 2023-09-10: 1000 mg via INTRAVENOUS

## 2023-09-10 MED ORDER — FENTANYL CITRATE (PF) 100 MCG/2ML IJ SOLN
100.0000 ug | Freq: Once | INTRAMUSCULAR | Status: AC
  Administered 2023-09-10: 100 ug via INTRAVENOUS
  Filled 2023-09-10: qty 2

## 2023-09-10 MED ORDER — AMISULPRIDE (ANTIEMETIC) 5 MG/2ML IV SOLN: 10.0000 mg | Freq: Once | INTRAVENOUS | Status: DC | PRN

## 2023-09-10 MED ORDER — PHENYLEPHRINE 80 MCG/ML (10ML) SYRINGE FOR IV PUSH (FOR BLOOD PRESSURE SUPPORT)
PREFILLED_SYRINGE | INTRAVENOUS | Status: AC
  Filled 2023-09-10: qty 10

## 2023-09-10 MED ORDER — MORPHINE SULFATE (PF) 2 MG/ML IV SOLN: 2.0000 mg | INTRAVENOUS | Status: DC | PRN

## 2023-09-10 MED ORDER — SCOPOLAMINE 1 MG/3DAYS TD PT72
1.0000 | MEDICATED_PATCH | TRANSDERMAL | Status: DC
  Administered 2023-09-10: 1.5 mg via TRANSDERMAL

## 2023-09-10 MED ORDER — MORPHINE SULFATE (PF) 4 MG/ML IV SOLN: 4.0000 mg | INTRAVENOUS | Status: DC | PRN

## 2023-09-10 MED ORDER — OXYCODONE HCL 5 MG/5ML PO SOLN: 5.0000 mg | Freq: Once | ORAL | Status: DC | PRN

## 2023-09-10 MED ORDER — LIDOCAINE HCL (PF) 2 % IJ SOLN
INTRAMUSCULAR | Status: AC
Start: 2023-09-10 — End: ?
  Filled 2023-09-10: qty 5

## 2023-09-10 MED ORDER — SCOPOLAMINE 1 MG/3DAYS TD PT72
MEDICATED_PATCH | TRANSDERMAL | Status: AC
Start: 2023-09-10 — End: 2023-09-10
  Filled 2023-09-10: qty 1

## 2023-09-10 MED ORDER — DEXAMETHASONE SODIUM PHOSPHATE 10 MG/ML IJ SOLN
INTRAMUSCULAR | Status: DC | PRN
  Administered 2023-09-10: 10 mg via INTRAVENOUS

## 2023-09-10 MED ORDER — ONDANSETRON HCL 4 MG/2ML IJ SOLN: 4.0000 mg | Freq: Once | INTRAMUSCULAR | Status: DC | PRN

## 2023-09-10 MED ORDER — KETOROLAC TROMETHAMINE 30 MG/ML IJ SOLN
INTRAMUSCULAR | Status: AC
  Filled 2023-09-10: qty 1

## 2023-09-10 MED ORDER — SODIUM CHLORIDE 0.9 % IV SOLN
2.0000 g | INTRAVENOUS | Status: DC
  Administered 2023-09-10 – 2023-09-11 (×2): 2 g via INTRAVENOUS
  Filled 2023-09-10 (×2): qty 20

## 2023-09-10 MED ORDER — MIDAZOLAM HCL 2 MG/2ML IJ SOLN
INTRAMUSCULAR | Status: DC | PRN
  Administered 2023-09-10: 2 mg via INTRAVENOUS

## 2023-09-10 MED ORDER — FENTANYL CITRATE (PF) 100 MCG/2ML IJ SOLN
INTRAMUSCULAR | Status: DC | PRN
  Administered 2023-09-10 (×2): 50 ug via INTRAVENOUS

## 2023-09-10 MED ORDER — HYDROMORPHONE HCL 1 MG/ML IJ SOLN: 0.2500 mg | INTRAMUSCULAR | Status: DC | PRN

## 2023-09-10 MED ORDER — LIDOCAINE HCL (PF) 2 % IJ SOLN
INTRAMUSCULAR | Status: DC | PRN
  Administered 2023-09-10: 100 mg via INTRADERMAL

## 2023-09-10 MED ORDER — IOHEXOL 300 MG/ML  SOLN
INTRAMUSCULAR | Status: DC | PRN
  Administered 2023-09-10: 5 mL

## 2023-09-10 MED ORDER — ACETAMINOPHEN 10 MG/ML IV SOLN: 1000.0000 mg | Freq: Once | INTRAVENOUS | Status: DC | PRN

## 2023-09-10 MED ORDER — ACETAMINOPHEN 10 MG/ML IV SOLN
INTRAVENOUS | Status: AC
  Filled 2023-09-10: qty 100

## 2023-09-10 MED ORDER — OXYCODONE HCL 5 MG PO TABS: 5.0000 mg | ORAL_TABLET | Freq: Once | ORAL | Status: DC | PRN

## 2023-09-10 MED ORDER — DIPHENHYDRAMINE HCL 50 MG/ML IJ SOLN
INTRAMUSCULAR | Status: AC
  Filled 2023-09-10: qty 1

## 2023-09-10 MED ORDER — ONDANSETRON HCL 4 MG/2ML IJ SOLN
INTRAMUSCULAR | Status: AC
Start: 2023-09-10 — End: ?
  Filled 2023-09-10: qty 2

## 2023-09-10 MED ORDER — MIDAZOLAM HCL 2 MG/2ML IJ SOLN
INTRAMUSCULAR | Status: AC
  Filled 2023-09-10: qty 2

## 2023-09-10 MED ORDER — DEXAMETHASONE SODIUM PHOSPHATE 10 MG/ML IJ SOLN
INTRAMUSCULAR | Status: AC
Start: 2023-09-10 — End: ?
  Filled 2023-09-10: qty 1

## 2023-09-10 MED ORDER — FENTANYL CITRATE (PF) 100 MCG/2ML IJ SOLN
INTRAMUSCULAR | Status: AC
  Filled 2023-09-10: qty 2

## 2023-09-10 MED ORDER — LACTATED RINGERS IV SOLN: INTRAVENOUS | Status: DC | PRN

## 2023-09-10 MED ORDER — DIPHENHYDRAMINE HCL 50 MG/ML IJ SOLN
6.2500 mg | Freq: Once | INTRAMUSCULAR | Status: AC
  Administered 2023-09-10: 6.25 mg via INTRAVENOUS

## 2023-09-10 SURGICAL SUPPLY — 15 items
BAG URO CATCHER STRL LF (MISCELLANEOUS) ×2 IMPLANT
CATH URETL OPEN 5X70 (CATHETERS) IMPLANT
CLOTH BEACON ORANGE TIMEOUT ST (SAFETY) ×2 IMPLANT
GLOVE BIOGEL M 7.0 STRL (GLOVE) ×2 IMPLANT
GOWN STRL REUS W/ TWL LRG LVL3 (GOWN DISPOSABLE) ×2 IMPLANT
GUIDEWIRE STR DUAL SENSOR (WIRE) ×2 IMPLANT
GUIDEWIRE ZIPWRE .038 STRAIGHT (WIRE) IMPLANT
KIT TURNOVER KIT A (KITS) IMPLANT
MANIFOLD NEPTUNE II (INSTRUMENTS) ×2 IMPLANT
PACK CYSTO (CUSTOM PROCEDURE TRAY) ×2 IMPLANT
SHEATH DILATOR SET 8/10 (MISCELLANEOUS) IMPLANT
STENT URET 6FRX24 CONTOUR (STENTS) IMPLANT
SYR 10ML LL (SYRINGE) ×2 IMPLANT
TUBING CONNECTING 10 (TUBING) ×2 IMPLANT
TUBING UROLOGY SET (TUBING) IMPLANT

## 2023-09-10 NOTE — Transfer of Care (Signed)
 Immediate Anesthesia Transfer of Care Note  Patient: Darlene Vazquez  Procedure(s) Performed: CYSTOSCOPY, WITH RETROGRADE PYELOGRAM AND URETERAL STENT INSERTION (Right: Ureter)  Patient Location: PACU  Anesthesia Type:General  Level of Consciousness: awake and patient cooperative  Airway & Oxygen Therapy: Patient Spontanous Breathing and Patient connected to nasal cannula oxygen  Post-op Assessment: Report given to RN and Post -op Vital signs reviewed and stable  Post vital signs: Reviewed and stable  Last Vitals:  Vitals Value Taken Time  BP    Temp    Pulse 109 09/10/23 1206  Resp 21 09/10/23 1206  SpO2 100 % 09/10/23 1206  Vitals shown include unfiled device data.  Last Pain:  Vitals:   09/10/23 0524  TempSrc: Oral  PainSc:          Complications: No notable events documented.

## 2023-09-10 NOTE — Op Note (Addendum)
 Preoperative diagnosis:  Right obstructing stone   Postoperative diagnosis:  same   Procedure:  Cystoscopy right ureteral stent placement right retrograde pyelography with interpretation   Surgeon: Darlene Lofts MD  Anesthesia: General  Complications: None  Intraoperative findings:  right retrograde pyelography demonstrated a filling defect within the right ureter consistent with the patient's known calculus without other abnormalities. -uncomplicated 6Fx24 cm ureteral stent on RIGHT -cloudly hydronephrotic gtt  EBL: Minimal  Specimens: None  Indication: Darlene Vazquez is a 38 y.o. patient with 7 mm right mid ureteral stone. After reviewing the management options for treatment, he elected to proceed with the above surgical procedure(s). We have discussed the potential benefits and risks of the procedure, side effects of the proposed treatment, the likelihood of the patient achieving the goals of the procedure, and any potential problems that might occur during the procedure or recuperation. Informed consent has been obtained.  Description of procedure:  The patient was taken to the operating room and general anesthesia was induced.  The patient was placed in the dorsal lithotomy position, prepped and draped in the usual sterile fashion, and preoperative antibiotics were administered. A preoperative time-out was performed.   Cystourethroscopy was performed.  The patient's urethra was examined and was normal. The bladder was then systematically examined in its entirety. There was no evidence for any bladder tumors, stones, or other mucosal pathology.    Attention then turned to the rightureteral orifice and a ureteral catheter was used to intubate the ureteral orifice.  Omnipaque contrast was injected through the ureteral catheter and a retrograde pyelogram was performed with findings as dictated above.  A 0.38 sensor guidewire was then advanced up the right ureter into the renal  pelvis under fluoroscopic guidance. The open ended catheter was advanced to renal pelvis, and sensor wire removed. Cloudly hydronephrotic gtt noted. Wire was replaced, and open ended catheter removed.  The wire was then backloaded through the cystoscope and a ureteral stent was advance over the wire using Seldinger technique.  The stent was positioned appropriately under fluoroscopic and cystoscopic guidance.  The wire was then removed with an adequate stent curl noted in the renal pelvis as well as in the bladder.  The bladder was then emptied and the procedure ended.  The patient appeared to tolerate the procedure well and without complications.  The patient was able to be awakened and transferred to the recovery unit in satisfactory condition.    Plan: Continue antibiotics. Messaged schedulers to arrange for outpatient followup and definitive treatment of stone.  Matt R. Samarie Pinder MD [facility]  Pager: [phone]

## 2023-09-10 NOTE — ED Notes (Signed)
 Report given to [facility] charge nurse, Shanda Bumps.

## 2023-09-10 NOTE — Anesthesia Preprocedure Evaluation (Addendum)
 Anesthesia Evaluation  Patient identified by MRN, date of birth, ID band Patient awake    Reviewed: Allergy & Precautions, NPO status , Patient's Chart, lab work & pertinent test results  History of Anesthesia Complications (+) PONV and history of anesthetic complications  Airway Mallampati: II  TM Distance: >3 FB Neck ROM: Full    Dental  (+) Teeth Intact, Dental Advisory Given   Pulmonary neg pulmonary ROS   Pulmonary exam normal breath sounds clear to auscultation       Cardiovascular (-) hypertension(-) angina (-) Past MI Normal cardiovascular exam Rhythm:Regular Rate:Tachycardia     Neuro/Psych    GI/Hepatic ,GERD  Controlled and Medicated,,  Endo/Other  negative endocrine ROS    Renal/GU Renal diseaseLab Results      Component                Value               Date                         K                        2.6 (LL)            09/09/2023                CO2                      25                  09/09/2023                BUN                      11                  09/09/2023                CREATININE               0.91                09/09/2023                    Musculoskeletal   Abdominal   Peds  Hematology Lab Results      Component                Value               Date                      WBC                      13.0 (H)            09/09/2023                HGB                      12.2                09/09/2023                HCT  37.7                09/09/2023                    PLT                      401 (H)             09/09/2023              Anesthesia Other Findings   Reproductive/Obstetrics negative OB ROS                             Anesthesia Physical Anesthesia Plan  ASA: 2  Anesthesia Plan: General   Post-op Pain Management:    Induction: Intravenous  PONV Risk Score and Plan: Treatment may vary due to age or medical  condition, Midazolam, Dexamethasone, Ondansetron and Scopolamine patch - Pre-op  Airway Management Planned: LMA  Additional Equipment: None  Intra-op Plan:   Post-operative Plan:   Informed Consent: I have reviewed the patients History and Physical, chart, labs and discussed the procedure including the risks, benefits and alternatives for the proposed anesthesia with the patient or authorized representative who has indicated his/her understanding and acceptance.     Dental advisory given  Plan Discussed with: CRNA and Surgeon  Anesthesia Plan Comments:        Anesthesia Quick Evaluation

## 2023-09-10 NOTE — Hospital Course (Signed)
 38 year old female with past medical history of hyperlipidemia, chronic constipation presenting to any hospital emergency department with several days of right upper quadrant pain with radiation to the right flank and fevers.  Upon evaluation in the emergency department patient was found to have multiple strokes criteria including tachycardia tachypnea and white blood cell count of 13 all concerning for sepsis.  Patient was found to have a 6 x 7 mm stone in the right distal ureter on CT imaging of the abdomen pelvis with moderate proximal hydroureteronephrosis.  EDP discussed case with Dr. Cardell Peach with urology.  The hospitalist group was called to assess the patient for admission the hospital and patient was subsequently transferred to [facility] for urgent urologic evaluation.

## 2023-09-10 NOTE — ED Notes (Signed)
 Transportation called ED to [facility] ED per MD request.

## 2023-09-10 NOTE — H&P (Addendum)
 Urology Consult Note   Requesting Attending Physician:  Marinda Elk, MD Service Providing Consult: Urology   Reason for Consult:  Nephrolithiasis  HPI: Darlene Vazquez is seen in consultation for reasons noted above at the request of Shalhoub, Deno Lunger, MD for evaluation of right ureteral stone.   Patient is a 38 year old female with past medical history significant for hyperlipidemia, chronic constipation, who presented to the ED with right upper quadrant pain with radiation to her right flank.  She also has had ongoing nausea and vomiting, and reported of a fever of 102.  Her ED course was remarkable for: CT scan showing 6X 7 mm distal right ureteral stone with proximal hydroureteronephrosis WBC 13 Creatinine 0.91 (baseline appears to be 0.7) and hypokalemic to 2.6 Urinalysis grossly infected, leukocyte and nitrite positive Hemodynamically stable, tachycardic, but normotensive not requiring pressors.  No fevers in the emergency department  Urology consulted for further management  Past Medical History: Past Medical History:  Diagnosis Date   GERD (gastroesophageal reflux disease)     Past Surgical History:  Past Surgical History:  Procedure Laterality Date   DIAGNOSTIC LAPAROSCOPY WITH REMOVAL OF ECTOPIC PREGNANCY Right 11/09/2013   Procedure: DIAGNOSTIC LAPAROSCOPY WITH REMOVAL OF ECTOPIC PREGNANCY;  Surgeon: Lazaro Arms, MD;  Location: [facility];  Service: Gynecology;  Laterality: Right;   ESOPHAGOGASTRODUODENOSCOPY N/A 08/12/2014   SLF: 1. No obvious source for epigastric pain identified. 2. Mild gastiritis.    LAPAROSCOPY N/A 11/09/2013   Procedure: LAPAROSCOPY OPERATIVE;  Surgeon: Lazaro Arms, MD;  Location: [facility];  Service: Gynecology;  Laterality: N/A;   UNILATERAL SALPINGECTOMY Right 11/09/2013   Procedure: UNILATERAL SALPINGECTOMY;  Surgeon: Lazaro Arms, MD;  Location: [facility];  Service: Gynecology;  Laterality: Right;    Medication: Current Facility-Administered  Medications  Medication Dose Route Frequency Provider Last Rate Last Admin   0.9 %  sodium chloride infusion   Intravenous Continuous Adefeso, Oladapo, DO   Stopped at 09/10/23 0629   acetaminophen (OFIRMEV) 10 MG/ML IV            acetaminophen (TYLENOL) tablet 650 mg  650 mg Oral Q6H PRN Adefeso, Oladapo, DO       Or   acetaminophen (TYLENOL) suppository 650 mg  650 mg Rectal Q6H PRN Adefeso, Oladapo, DO       atorvastatin (LIPITOR) tablet 20 mg  20 mg Oral QPM Adefeso, Oladapo, DO       cefTRIAXone (ROCEPHIN) 1 g in sodium chloride 0.9 % 100 mL IVPB  1 g Intravenous Q24H Adefeso, Oladapo, DO       linaclotide (LINZESS) capsule 72 mcg  72 mcg Oral Daily Adefeso, Oladapo, DO       ondansetron (ZOFRAN) tablet 4 mg  4 mg Oral Q6H PRN Adefeso, Oladapo, DO       Or   ondansetron (ZOFRAN) injection 4 mg  4 mg Intravenous Q6H PRN Adefeso, Oladapo, DO   4 mg at 09/10/23 0523   pantoprazole (PROTONIX) injection 40 mg  40 mg Intravenous Q24H Adefeso, Oladapo, DO   40 mg at 09/09/23 2326   Current Outpatient Medications  Medication Sig Dispense Refill   atorvastatin (LIPITOR) 20 MG tablet Take 1 tablet (20 mg total) by mouth every evening. 30 tablet 2   HYDROcodone-acetaminophen (NORCO/VICODIN) 5-325 MG tablet Take 1 tablet by mouth every 4 (four) hours as needed. (Patient not taking: Reported on 01/05/2019) 8 tablet 0   ibuprofen (ADVIL) 200 MG tablet Take 200 mg by mouth  daily as needed for mild pain or moderate pain.     linaclotide (LINZESS) 72 MCG capsule Take 1 capsule (72 mcg total) by mouth daily. 90 capsule 0   naloxegol oxalate (MOVANTIK) 12.5 MG TABS tablet Take 1 tablet (12.5 mg total) by mouth daily. (Patient not taking: Reported on 01/05/2019) 45 tablet 3   naproxen (NAPROSYN) 500 MG tablet Take 1 tablet (500 mg total) by mouth 2 (two) times daily. 30 tablet 0   pantoprazole (PROTONIX) 40 MG tablet TAKE ONE (1) TABLET BY MOUTH EVERY DAY 90 tablet 0   pantoprazole (PROTONIX) 40 MG tablet Take  1 tablet (40 mg total) by mouth daily. 90 tablet 0   Prucalopride Succinate (MOTEGRITY) 2 MG TABS Take 2 mg by mouth daily. (Patient not taking: Reported on 01/05/2019) 30 tablet 11    Allergies: Allergies  Allergen Reactions   Linzess [Linaclotide]     ABDOMINAL CRAMPS   Other     Hair dye    Social History: Social History   Tobacco Use   Smoking status: Never   Smokeless tobacco: Never   Tobacco comments:    Never smoked  Vaping Use   Vaping status: Never Used  Substance Use Topics   Alcohol use: No    Alcohol/week: 0.0 standard drinks of alcohol   Drug use: No    Family History Family History  Problem Relation Age of Onset   Wilson's disease Maternal Grandfather    Colon cancer Neg Hx    Colon polyps Neg Hx     Review of Systems 10 systems were reviewed and are negative except as noted specifically in the HPI.  Objective   Vital signs in last 24 hours: BP 120/78   Pulse (!) 110   Temp 98.9 F (37.2 C) (Oral)   Resp (!) 24   Ht 5\' 2"  (1.575 m)   Wt 93 kg   SpO2 95%   BMI 37.49 kg/m   Physical Exam General: NAD, A&O. Appears moderately sick HEENT: [place]/AT, EOMI, MMM Pulmonary: Normal work of breathing Cardiovascular: HDS, adequate peripheral perfusion Abdomen: Soft, nondistended. GU: VS Extremities: warm and well perfused Neuro: Appropriate, no focal neurological deficits  Most Recent Labs: Lab Results  Component Value Date   WBC 13.0 (H) 09/09/2023   HGB 12.2 09/09/2023   HCT 37.7 09/09/2023   PLT 401 (H) 09/09/2023    Lab Results  Component Value Date   NA 136 09/09/2023   K 2.6 (LL) 09/09/2023   CL 98 09/09/2023   CO2 25 09/09/2023   BUN 11 09/09/2023   CREATININE 0.91 09/09/2023   CALCIUM 9.0 09/09/2023    No results found for: "INR", "APTT"   Urine Culture: @[id number] (laburin,org,[id number])@   IMAGING: CT ABDOMEN PELVIS W CONTRAST Result Date: 09/09/2023 CLINICAL DATA:  Provided history: Biliary obstruction suspected (Ped  0-17y) ruq pain, chole? EXAM: CT ABDOMEN AND PELVIS WITH CONTRAST TECHNIQUE: Multidetector CT imaging of the abdomen and pelvis was performed using the standard protocol following bolus administration of intravenous contrast. RADIATION DOSE REDUCTION: This exam was performed according to the departmental dose-optimization program which includes automated exposure control, adjustment of the mA and/or kV according to patient size and/or use of iterative reconstruction technique. CONTRAST:  OMNIPAQUE IOHEXOL 300 MG/ML  SOLN COMPARISON:  CT 09/12/2018 FINDINGS: Lower chest: Subsegmental atelectasis in the right lower lobe. No pleural effusion. Hepatobiliary: No focal liver abnormality is seen. No gallstones, gallbladder wall thickening, or biliary dilatation. Pancreas: No ductal dilatation or inflammation. Spleen:  Normal in size without focal abnormality. Adrenals/Urinary Tract: No adrenal nodule. Obstructing 6 x 7 mm stone in the distal right ureter (at the level of the upper sacrum) with moderate proximal hydroureteronephrosis. There is mild ureteral enhancement with perinephric and ureteric fat stranding the left kidney is unremarkable. Unremarkable urinary bladder. Stomach/Bowel: Colonic diverticulosis without diverticulitis. No bowel obstruction or inflammation. The appendix is normal. Vascular/Lymphatic: No acute vascular findings. Normal caliber abdominal aorta. Portal vein is patent. No abdominopelvic adenopathy. Reproductive: Lobulated uterus with multiple fibroids. Other: No free air or ascites. Moderate fat containing umbilical hernia, no bowel involvement. Musculoskeletal: There are no acute or suspicious osseous abnormalities. IMPRESSION: 1. Obstructing 6 x 7 mm stone in the distal right ureter with moderate proximal hydroureteronephrosis. 2. Colonic diverticulosis without diverticulitis. 3. Fibroid uterus. 4. Moderate fat containing umbilical hernia. Electronically Signed   By: Narda Rutherford M.D.    On: 09/09/2023 21:41    ------  Assessment:  38 y.o. female with 7 mm distal right ureteral stone.  We discussed indications for ureteral decompression, which include infection obstruction, intractable pain, AKI, or persistent nausea vomiting with inability to take p.o.  Given her concern for infected obstruction, we plan for urgent right ureteral stent placement.  We went over the benefits, risks, and alternatives to surgery, patient is amenable and wishes to proceed.  We also discussed that in the inability to place a stent, she would need to go with interventional radiology for right nephrostomy tube.   Recommendations: Plan for emergent right ureteral stent placement Case posted; Urology will discuss with anesthesia Keep patient NPO Consent obtained in preoperative area Urine culture pending Continue broad-spectrum antibiotics   Roby Lofts, MD Resident Physician [facility]   Thank you for this consult. Please contact the urology consult pager with any further questions/concerns.  I have seen and examined the patient and agree with the above assessment and plan.   Distal right ureteral stone with infection. Plan for right ureteral stent. Will arrange f/u for definitive treatment outpatient.   -The risks, benefits and alternatives of cystoscopy with right JJ stent placement was discussed with the patient.  Risks include, but are not limited to: bleeding, urinary tract infection, ureteral injury, ureteral stricture disease, chronic pain, urinary symptoms, bladder injury, stent migration, the need for nephrostomy tube placement, MI, CVA, DVT, PE and the inherent risks with general anesthesia.  The patient voices understanding and wishes to proceed.   Matt R. Shneur [place]itten[place] MD [facility]  Pager: [phone]

## 2023-09-10 NOTE — ED Provider Notes (Signed)
 Patient will be transferred to [facility] emergency department to be seen by urology to be taken to the operating room for definitive management of her ureteral stone Patient is mildly tachycardic but her blood pressure has remained stable   Zadie Rhine, MD 09/10/23 [phone]

## 2023-09-10 NOTE — ED Notes (Signed)
 Pt ambulated to restroom.

## 2023-09-10 NOTE — Anesthesia Postprocedure Evaluation (Signed)
 Anesthesia Post Note  Patient: Darlene Vazquez  Procedure(s) Performed: CYSTOSCOPY, WITH RETROGRADE PYELOGRAM AND URETERAL STENT INSERTION (Right: Ureter)     Patient location during evaluation: PACU Anesthesia Type: General Level of consciousness: awake and alert Pain management: pain level controlled Vital Signs Assessment: post-procedure vital signs reviewed and stable Respiratory status: spontaneous breathing, nonlabored ventilation, respiratory function stable and patient connected to nasal cannula oxygen Cardiovascular status: blood pressure returned to baseline and stable Postop Assessment: no apparent nausea or vomiting Anesthetic complications: no  No notable events documented.  Last Vitals:  Vitals:   09/10/23 1315 09/10/23 1338  BP: 126/79 138/71  Pulse: 98 100  Resp: 20 20  Temp:  36.8 C  SpO2: 95% 94%    Last Pain:  Vitals:   09/10/23 1400  TempSrc:   PainSc: 0-No pain                 Trevor Iha

## 2023-09-10 NOTE — Anesthesia Procedure Notes (Signed)
 Procedure Name: LMA Insertion Date/Time: 09/10/2023 11:41 AM  Performed by: Vanessa Granite, CRNAPre-anesthesia Checklist: Emergency Drugs available, Patient identified, Suction available and Patient being monitored Patient Re-evaluated:Patient Re-evaluated prior to induction Oxygen Delivery Method: Circle system utilized Preoxygenation: Pre-oxygenation with 100% oxygen Induction Type: IV induction Ventilation: Mask ventilation without difficulty LMA: LMA inserted and LMA with gastric port inserted LMA Size: 4.0 Number of attempts: 1 Placement Confirmation: positive ETCO2 and breath sounds checked- equal and bilateral Tube secured with: Tape Dental Injury: Teeth and Oropharynx as per pre-operative assessment

## 2023-09-10 NOTE — ED Provider Notes (Signed)
 Patient awake and alert, able to walk to the restroom on her own prior to transfer to Epimenio Sarin, MD 09/10/23 [phone]

## 2023-09-10 NOTE — Plan of Care (Signed)

## 2023-09-11 ENCOUNTER — Encounter (HOSPITAL_COMMUNITY): Payer: Self-pay | Admitting: Urology

## 2023-09-11 ENCOUNTER — Other Ambulatory Visit (HOSPITAL_COMMUNITY): Payer: Self-pay

## 2023-09-11 DIAGNOSIS — A419 Sepsis, unspecified organism: Secondary | ICD-10-CM | POA: Diagnosis not present

## 2023-09-11 LAB — COMPREHENSIVE METABOLIC PANEL
ALT: 30 U/L (ref 0–44)
AST: 23 U/L (ref 15–41)
Albumin: 3 g/dL — ABNORMAL LOW (ref 3.5–5.0)
Alkaline Phosphatase: 77 U/L (ref 38–126)
Anion gap: 10 (ref 5–15)
BUN: 10 mg/dL (ref 6–20)
CO2: 23 mmol/L (ref 22–32)
Calcium: 8.7 mg/dL — ABNORMAL LOW (ref 8.9–10.3)
Chloride: 106 mmol/L (ref 98–111)
Creatinine, Ser: 0.79 mg/dL (ref 0.44–1.00)
GFR, Estimated: >60 mL/min (ref >60)
Glucose, Bld: 159 mg/dL — ABNORMAL HIGH (ref 70–99)
Potassium: 3.5 mmol/L (ref 3.5–5.1)
Sodium: 139 mmol/L (ref 135–145)
Total Bilirubin: 0.4 mg/dL (ref 0.0–1.2)
Total Protein: 7.1 g/dL (ref 6.5–8.1)

## 2023-09-11 LAB — CBC WITH DIFFERENTIAL/PLATELET
Abs Immature Granulocytes: 0.07 10*3/uL (ref 0.00–0.07)
Basophils Absolute: 0 10*3/uL (ref 0.0–0.1)
Basophils Relative: 0 %
Eosinophils Absolute: 0 10*3/uL (ref 0.0–0.5)
Eosinophils Relative: 0 %
HCT: 34.3 % — ABNORMAL LOW (ref 36.0–46.0)
Hemoglobin: 10.7 g/dL — ABNORMAL LOW (ref 12.0–15.0)
Immature Granulocytes: 1 %
Lymphocytes Relative: 14 %
Lymphs Abs: 1.6 10*3/uL (ref 0.7–4.0)
MCH: 26.6 pg (ref 26.0–34.0)
MCHC: 31.2 g/dL (ref 30.0–36.0)
MCV: 85.3 fL (ref 80.0–100.0)
Monocytes Absolute: 0.9 10*3/uL (ref 0.1–1.0)
Monocytes Relative: 8 %
Neutro Abs: 8.6 10*3/uL — ABNORMAL HIGH (ref 1.7–7.7)
Neutrophils Relative %: 77 %
Platelets: 414 10*3/uL — ABNORMAL HIGH (ref 150–400)
RBC: 4.02 MIL/uL (ref 3.87–5.11)
RDW: 14.5 % (ref 11.5–15.5)
WBC: 11.2 10*3/uL — ABNORMAL HIGH (ref 4.0–10.5)
nRBC: 0 % (ref 0.0–0.2)

## 2023-09-11 MED ORDER — PANTOPRAZOLE SODIUM 40 MG PO TBEC: 40.0000 mg | DELAYED_RELEASE_TABLET | Freq: Every day | ORAL | Status: DC

## 2023-09-11 MED ORDER — CEPHALEXIN 500 MG PO CAPS
500.0000 mg | ORAL_CAPSULE | Freq: Three times a day (TID) | ORAL | 0 refills | Status: AC
  Filled 2023-09-11 (×2): qty 12, 4d supply, fill #0

## 2023-09-11 MED ORDER — CEPHALEXIN 500 MG PO CAPS: 500.0000 mg | ORAL_CAPSULE | Freq: Three times a day (TID) | ORAL | Status: DC

## 2023-09-11 NOTE — Progress Notes (Signed)
 AVS reviewed w/ pt who verbalized an understanding - no other questions at this time. PIV removed as noted - central tele notified by primary nurse of d/c. TOC med in a secure bag delivered to pt in room  by this RN. Pt home w/ s/o.

## 2023-09-11 NOTE — Assessment & Plan Note (Signed)
  Patient presented with multiple SIRS criteria including leukocytosis tachycardia tachypnea followed in the setting of presumed urinary tract infection Urinalysis at [facility] was suggestive of urinary tract infection, unfortunately a urine culture was never sent. Blood cultures are without growth at this point Treating with intravenous ceftriaxone, monitoring for clinical improvement Patient is status post right ureteral stent placement with substantial improvement in symptoms. Continue monitoring overnight and if continued clinical improvement possible discharge tomorrow.

## 2023-09-11 NOTE — Progress Notes (Signed)
 PROGRESS NOTE   Darlene Vazquez  MVH:[id number] DOB: 15-Jan-1986 DOA: 09/09/2023 PCP: Sheela Stack    Brief Narrative:  38 year old female with past medical history of hyperlipidemia, chronic constipation presenting to any hospital emergency department with several days of right upper quadrant pain with radiation to the right flank and fevers.  Upon evaluation in the emergency department patient was found to have multiple strokes criteria including tachycardia tachypnea and white blood cell count of 13 all concerning for sepsis.  Patient was found to have a 6 x 7 mm stone in the right distal ureter on CT imaging of the abdomen pelvis with moderate proximal hydroureteronephrosis.  EDP discussed case with Dr. Cardell Peach with urology.  The hospitalist group was called to assess the patient for admission the hospital and patient was subsequently transferred to [facility] for urgent urologic evaluation.   Assessment & Plan Sepsis, due to unspecified organism, unspecified whether acute organ dysfunction present [facility])  Patient presented with multiple SIRS criteria including leukocytosis tachycardia tachypnea followed in the setting of presumed urinary tract infection Urinalysis at [facility] was suggestive of urinary tract infection, unfortunately a urine culture was never sent. Blood cultures are without growth at this point Treating with intravenous ceftriaxone, monitoring for clinical improvement Patient is status post right ureteral stent placement with substantial improvement in symptoms. Continue monitoring overnight and if continued clinical improvement possible discharge tomorrow.  Urinary tract obstruction by kidney stone Status post right ureteral stent placement as noted above. Patient will need outpatient follow-up with urology after discharge. Hypokalemia Replacing with potassium chloride Evaluating for concurrent hypomagnesemia  Monitoring potassium levels  with serial chemistries.  Mixed hyperlipidemia Continuing home regimen of lipid lowering therapy.  GERD without esophagitis Daily proton pump inhibitor Patient reports longstanding dyspepsia despite PPI use.  Advised outpatient follow-up with gastroenterology.     Subjective:  Patient reports that her right flank pain is substantially improving compared to this morning.  Pain is now mild in intensity, located in the right flank, not radiating, worse with movement.  Reports associated dark urine.  Physical Exam:  Vitals:   09/10/23 1315 09/10/23 1338 09/10/23 1607 09/10/23 2133  BP: 126/79 138/71 122/83 135/69  Pulse: 98 100 98 96  Resp: 20 20 17 16   Temp:  98.2 F (36.8 C) 97.9 F (36.6 C) 98.5 F (36.9 C)  TempSrc:  Oral    SpO2: 95% 94% 96% 100%  Weight:      Height:        Constitutional: Awake alert and oriented x3, no associated distress.   Skin: no rashes, no lesions, good skin turgor noted. Eyes: Pupils are equally reactive to light.  No evidence of scleral icterus or conjunctival pallor.  ENMT: Moist mucous membranes noted.  Posterior pharynx clear of any exudate or lesions.   Respiratory: clear to auscultation bilaterally, no wheezing, no crackles. Normal respiratory effort. No accessory muscle use.  Cardiovascular: Regular rate and rhythm, no murmurs / rubs / gallops. No extremity edema. 2+ pedal pulses. No carotid bruits.  Abdomen: Right-sided abdominal tenderness, abdomen is soft.  No evidence of intra-abdominal masses.  Positive bowel sounds noted in all quadrants.   Musculoskeletal: No joint deformity upper and lower extremities. Good ROM, no contractures. Normal muscle tone.    Data Reviewed:  I have personally reviewed and interpreted labs, imaging.  Significant findings are   CBC: Recent Labs  Lab 09/09/23 1859 09/10/23 1402  WBC 13.0* 9.3  NEUTROABS  --  7.6  HGB 12.2 11.2*  HCT 37.7 35.9*  MCV 82.5 86.7  PLT 401* 375   Basic Metabolic  Panel: Recent Labs  Lab 09/09/23 1859 09/10/23 1402  NA 136 138  K 2.6* 3.6  CL 98 103  CO2 25 22  GLUCOSE 151* 149*  BUN 11 9  CREATININE 0.91 0.83  CALCIUM 9.0 8.7*  MG  --  2.8*  PHOS  --  2.6   GFR: Estimated Creatinine Clearance: 95.7 mL/min (by C-G formula based on SCr of 0.83 mg/dL). Liver Function Tests: Recent Labs  Lab 09/09/23 1859 09/10/23 1402  AST 20 18  ALT 22 21  ALKPHOS 92 80  BILITOT 1.5* 0.7  PROT 8.0 7.3  ALBUMIN 3.6 3.3*      Code Status:  Full code.  Code status decision has been confirmed with: patient Family Communication: mother and husband are at the bedside and have been updated on plan of care.   Severity of Illness:  The appropriate patient status for this patient is INPATIENT. Inpatient status is judged to be reasonable and necessary in order to provide the required intensity of service to ensure the patient's safety. The patient's presenting symptoms, physical exam findings, and initial radiographic and laboratory data in the context of their chronic comorbidities is felt to place them at high risk for further clinical deterioration. Furthermore, it is not anticipated that the patient will be medically stable for discharge from the hospital within 2 midnights of admission.   * I certify that at the point of admission it is my clinical judgment that the patient will require inpatient hospital care spanning beyond 2 midnights from the point of admission due to high intensity of service, high risk for further deterioration and high frequency of surveillance required.*  Time spent:  50 minutes  Author:  Marinda Elk MD

## 2023-09-11 NOTE — Assessment & Plan Note (Signed)
 Status post right ureteral stent placement as noted above. Patient will need outpatient follow-up with urology after discharge.

## 2023-09-11 NOTE — Discharge Summary (Signed)
 Physician Discharge Summary  Darlene Vazquez ZOX:[id number] DOB: Dec 11, 1985 DOA: 09/09/2023  PCP: Royann Shivers, PA-C  Admit date: 09/09/2023 Discharge date: 09/11/2023  Admitted From: Home Disposition:  Home  Discharge Condition:Stable CODE STATUS:FULL Diet recommendation:  Regular   Brief/Interim Summary: 38 year old female with past medical history of hyperlipidemia, chronic constipation presenting to any hospital emergency department with several days of right upper quadrant pain with radiation to the right flank and fevers.  She was tachycardic, tachypneic on condition with elevated white cell count.  CT imaging showed 6 x 7 mm stone in the right distal ureter with moderate proximal hydroureteronephrosis.  Urology consulted, status post right ureteral stent placement.  Currently she is hemodynamically stable, afebrile, does not complain of any pain. Aerobic/Anaerobic culture showed  few gram-negative rods.  She has responded very well to ceftriaxone.  Will change antibiotics to Keflex on discharge.  She will follow-up with urology as an outpatient.  Urology has cleared for discharge today.   Discharge Diagnoses:  Principal Problem:   Sepsis, due to unspecified organism, unspecified whether acute organ dysfunction present [facility]) Active Problems:   Constipation   Urinary tract obstruction by kidney stone   Hypokalemia   Mixed hyperlipidemia   GERD without esophagitis   Obesity, Class II, BMI 35-39.9    Discharge Instructions  Discharge Instructions     Diet general   Complete by: As directed    Discharge instructions   Complete by: As directed    1)Please take prescribed medications as instructed 2)Follow up with Urology as outpatient   Increase activity slowly   Complete by: As directed       Allergies as of 09/11/2023       Reactions   Linzess [linaclotide] Other (See Comments)   ABDOMINAL CRAMPS   Other Hives, Other (See Comments)   Hair dye   Sulfa  Antibiotics Hives, Itching        Medication List     STOP taking these medications    HYDROcodone-acetaminophen 5-325 MG tablet Commonly known as: NORCO/VICODIN   naloxegol oxalate 12.5 MG Tabs tablet Commonly known as: Movantik   naproxen 500 MG tablet Commonly known as: NAPROSYN       TAKE these medications    atorvastatin 20 MG tablet Commonly known as: LIPITOR Take 1 tablet (20 mg total) by mouth every evening.   cephALEXin 500 MG capsule Commonly known as: KEFLEX Take 1 capsule (500 mg total) by mouth every 8 (eight) hours for 4 days. Start taking on: September 12, 2023   Linzess 72 MCG capsule Generic drug: linaclotide Take 1 capsule (72 mcg total) by mouth daily.   pantoprazole 40 MG tablet Commonly known as: Protonix Take 1 tablet (40 mg total) by mouth daily. What changed:  when to take this Another medication with the same name was removed. Continue taking this medication, and follow the directions you see here.   Prucalopride Succinate 2 MG Tabs Commonly known as: Motegrity Take 2 mg by mouth daily.   TYLENOL 500 MG tablet Generic drug: acetaminophen Take 500-1,000 mg by mouth every 8 (eight) hours as needed for mild pain (pain score 1-3) or headache.        Follow-up Information     Royann Shivers, PA-C. Schedule an appointment as soon as possible for a visit in 1 week(s).   Specialty: Physician Field seismologist information: [address] [place] [address] [phone]  Allergies  Allergen Reactions   Linzess [Linaclotide] Other (See Comments)    ABDOMINAL CRAMPS   Other Hives and Other (See Comments)    Hair dye   Sulfa Antibiotics Hives and Itching    Consultations: Urology   Procedures/Studies: DG C-Arm 1-60 Min-No Report Result Date: 09/10/2023 Fluoroscopy was utilized by the requesting physician.  No radiographic interpretation.   CT ABDOMEN PELVIS W CONTRAST Result Date: 09/09/2023 CLINICAL  DATA:  Provided history: Biliary obstruction suspected (Ped 0-17y) ruq pain, chole? EXAM: CT ABDOMEN AND PELVIS WITH CONTRAST TECHNIQUE: Multidetector CT imaging of the abdomen and pelvis was performed using the standard protocol following bolus administration of intravenous contrast. RADIATION DOSE REDUCTION: This exam was performed according to the departmental dose-optimization program which includes automated exposure control, adjustment of the mA and/or kV according to patient size and/or use of iterative reconstruction technique. CONTRAST:  OMNIPAQUE IOHEXOL 300 MG/ML  SOLN COMPARISON:  CT 09/12/2018 FINDINGS: Lower chest: Subsegmental atelectasis in the right lower lobe. No pleural effusion. Hepatobiliary: No focal liver abnormality is seen. No gallstones, gallbladder wall thickening, or biliary dilatation. Pancreas: No ductal dilatation or inflammation. Spleen: Normal in size without focal abnormality. Adrenals/Urinary Tract: No adrenal nodule. Obstructing 6 x 7 mm stone in the distal right ureter (at the level of the upper sacrum) with moderate proximal hydroureteronephrosis. There is mild ureteral enhancement with perinephric and ureteric fat stranding the left kidney is unremarkable. Unremarkable urinary bladder. Stomach/Bowel: Colonic diverticulosis without diverticulitis. No bowel obstruction or inflammation. The appendix is normal. Vascular/Lymphatic: No acute vascular findings. Normal caliber abdominal aorta. Portal vein is patent. No abdominopelvic adenopathy. Reproductive: Lobulated uterus with multiple fibroids. Other: No free air or ascites. Moderate fat containing umbilical hernia, no bowel involvement. Musculoskeletal: There are no acute or suspicious osseous abnormalities. IMPRESSION: 1. Obstructing 6 x 7 mm stone in the distal right ureter with moderate proximal hydroureteronephrosis. 2. Colonic diverticulosis without diverticulitis. 3. Fibroid uterus. 4. Moderate fat containing  umbilical hernia. Electronically Signed   By: Narda Rutherford M.D.   On: 09/09/2023 21:41      Subjective: Patient seen and examined at bedside today.  Hemodynamically stable.  Comfortable.  Denies abdomen pain, nausea or vomiting.  Afebrile.  Eager to go home  Discharge Exam: Vitals:   09/10/23 2133 09/11/23 0606  BP: 135/69 137/81  Pulse: 96 81  Resp: 16 15  Temp: 98.5 F (36.9 C) 97.7 F (36.5 C)  SpO2: 100% 100%   Vitals:   09/10/23 1338 09/10/23 1607 09/10/23 2133 09/11/23 0606  BP: 138/71 122/83 135/69 137/81  Pulse: 100 98 96 81  Resp: 20 17 16 15   Temp: 98.2 F (36.8 C) 97.9 F (36.6 C) 98.5 F (36.9 C) 97.7 F (36.5 C)  TempSrc: Oral     SpO2: 94% 96% 100% 100%  Weight:      Height:        General: Pt is alert, awake, not in acute distress Cardiovascular: RRR, S1/S2 +, no rubs, no gallops Respiratory: CTA bilaterally, no wheezing, no rhonchi Abdominal: Soft, NT, ND, bowel sounds + Extremities: no edema, no cyanosis    The results of significant diagnostics from this hospitalization (including imaging, microbiology, ancillary and laboratory) are listed below for reference.     Microbiology: Recent Results (from the past 240 hours)  Aerobic/Anaerobic Culture w Gram Stain (surgical/deep wound)     Status: None (Preliminary result)   Collection Time: 09/10/23 11:56 AM   Specimen: Path fluid; Body Fluid  Result  Value Ref Range Status   Specimen Description   Final    CYSTOSCOPY RIGHT PELVIS Performed at [facility], [address] [place], [place] [address]    Special Requests   Final    NONE Performed at [facility], [address] [place], [place] [address]    Gram Stain   Final    MODERATE WBC PRESENT, PREDOMINANTLY PMN FEW GRAM NEGATIVE RODS    Culture   Final    CULTURE REINCUBATED FOR BETTER GROWTH Performed at [facility], [address] [place], [place] [address]    Report Status PENDING  Incomplete   Culture, blood (Routine X 2) w Reflex to ID Panel     Status: None (Preliminary result)   Collection Time: 09/10/23  2:02 PM   Specimen: BLOOD RIGHT ARM  Result Value Ref Range Status   Specimen Description   Final    BLOOD RIGHT ARM Performed at [facility], [address], [place], [place] [address]    Special Requests   Final    BOTTLES DRAWN AEROBIC AND ANAEROBIC Blood Culture results may not be optimal due to an inadequate volume of blood received in culture bottles Performed at [facility], [address], [place], [place] [address]    Culture   Final    NO GROWTH < 24 HOURS Performed at [facility], [address], [place], [place] [address]    Report Status PENDING  Incomplete  Culture, blood (Routine X 2) w Reflex to ID Panel     Status: None (Preliminary result)   Collection Time: 09/10/23  2:02 PM   Specimen: BLOOD RIGHT ARM  Result Value Ref Range Status   Specimen Description   Final    BLOOD RIGHT ARM Performed at [facility], [address], [place], [place] [address]    Special Requests   Final    BOTTLES DRAWN AEROBIC ONLY Blood Culture results may not be optimal due to an inadequate volume of blood received in culture bottles Performed at [facility], [address] [place], [place] [address]    Culture   Final    NO GROWTH < 24 HOURS Performed at [facility], [address] [place], [place] [address]    Report Status PENDING  Incomplete     Labs: BNP (last 3 results) No results for input(s): "BNP" in the last 8760 hours. Basic Metabolic Panel: Recent Labs  Lab 09/09/23 1859 09/10/23 1402 09/11/23 0428  NA 136 138 139  K 2.6* 3.6 3.5  CL 98 103 106  CO2 25 22 23   GLUCOSE 151* 149* 159*  BUN 11 9 10   CREATININE 0.91 0.83 0.79  CALCIUM 9.0 8.7* 8.7*  MG  --  2.8*  --   PHOS  --  2.6  --    Liver Function Tests: Recent Labs  Lab 09/09/23 1859 09/10/23 1402 09/11/23 0428   AST 20 18 23   ALT 22 21 30   ALKPHOS 92 80 77  BILITOT 1.5* 0.7 0.4  PROT 8.0 7.3 7.1  ALBUMIN 3.6 3.3* 3.0*   Recent Labs  Lab 09/09/23 1859  LIPASE 29   No results for input(s): "AMMONIA" in the last 168 hours. CBC: Recent Labs  Lab 09/09/23 1859 09/10/23 1402 09/11/23 0428  WBC 13.0* 9.3 11.2*  NEUTROABS  --  7.6 8.6*  HGB 12.2 11.2* 10.7*  HCT 37.7 35.9* 34.3*  MCV 82.5 86.7 85.3  PLT 401* 375 414*   Cardiac Enzymes: No results for input(s): "CKTOTAL", "CKMB", "CKMBINDEX", "TROPONINI" in the last 168 hours. BNP: Invalid input(s): "POCBNP" CBG: No results for input(s): "GLUCAP" in the last 168 hours. D-Dimer No results for input(s): "DDIMER" in the last 72 hours. Hgb A1c No results for input(s): "HGBA1C" in the last 72 hours. Lipid Profile No results for input(s): "CHOL", "HDL", "LDLCALC", "TRIG", "CHOLHDL", "LDLDIRECT" in the last 72 hours. Thyroid function studies No results for input(s): "TSH", "T4TOTAL", "T3FREE", "THYROIDAB" in the last 72 hours.  Invalid input(s): "FREET3" Anemia work up No results for input(s): "VITAMINB12", "FOLATE", "FERRITIN", "TIBC", "IRON", "RETICCTPCT" in the last 72 hours. Urinalysis    Component Value Date/Time   COLORURINE YELLOW 09/09/2023 2045   APPEARANCEUR CLOUDY (A) 09/09/2023 2045   LABSPEC 1.014 09/09/2023 2045   PHURINE 5.0 09/09/2023 2045   GLUCOSEU NEGATIVE 09/09/2023 2045   HGBUR MODERATE (A) 09/09/2023 2045   BILIRUBINUR NEGATIVE 09/09/2023 2045   KETONESUR 5 (A) 09/09/2023 2045   PROTEINUR 100 (A) 09/09/2023 2045   UROBILINOGEN 0.2 05/20/2014 1615   NITRITE POSITIVE (A) 09/09/2023 2045   LEUKOCYTESUR LARGE (A) 09/09/2023 2045   Sepsis Labs Recent Labs  Lab 09/09/23 1859 09/10/23 1402 09/11/23 0428  WBC 13.0* 9.3 11.2*   Microbiology Recent Results (from the past 240 hours)  Aerobic/Anaerobic Culture w Gram Stain (surgical/deep wound)     Status: None (Preliminary result)   Collection Time:  09/10/23 11:56 AM   Specimen: Path fluid; Body Fluid  Result Value Ref Range Status   Specimen Description   Final    CYSTOSCOPY RIGHT PELVIS Performed at [facility], [address], [place], [place] [address]    Special Requests   Final    NONE Performed at [facility], [address], [place], [place] [address]    Gram Stain   Final    MODERATE WBC PRESENT, PREDOMINANTLY PMN FEW GRAM NEGATIVE RODS    Culture   Final    CULTURE REINCUBATED FOR BETTER GROWTH Performed at [facility], [address], [place], [place] [address]    Report Status PENDING  Incomplete  Culture, blood (Routine X 2) w Reflex to ID Panel     Status: None (Preliminary result)   Collection Time: 09/10/23  2:02 PM   Specimen: BLOOD RIGHT ARM  Result Value Ref Range Status   Specimen Description   Final    BLOOD RIGHT ARM Performed at [facility], [address] [place], [place] [address]    Special Requests   Final    BOTTLES DRAWN AEROBIC AND ANAEROBIC Blood Culture results may not be optimal due to an inadequate volume of blood received in culture bottles Performed at [facility], [address], [place], [place] [address]    Culture   Final    NO GROWTH < 24 HOURS Performed at [facility], [address] [place], [place] [address]    Report Status PENDING  Incomplete  Culture, blood (Routine X 2) w Reflex to ID Panel     Status: None (Preliminary result)   Collection Time: 09/10/23  2:02 PM   Specimen: BLOOD RIGHT ARM  Result Value Ref Range Status   Specimen Description   Final    BLOOD RIGHT ARM Performed at [facility], [address] [place], [place] [address]    Special Requests   Final  BOTTLES DRAWN AEROBIC ONLY Blood Culture results may not be optimal due to an inadequate volume of blood received in culture bottles Performed at [facility], [address] [place], [place] [address]     Culture   Final    NO GROWTH < 24 HOURS Performed at [facility], [address] [place], [place] [address]    Report Status PENDING  Incomplete    Please note: You were cared for by a hospitalist during your hospital stay. Once you are discharged, your primary care physician will handle any further medical issues. Please note that NO REFILLS for any discharge medications will be authorized once you are discharged, as it is imperative that you return to your primary care physician (or establish a relationship with a primary care physician if you do not have one) for your post hospital discharge needs so that they can reassess your need for medications and monitor your lab values.    Time coordinating discharge: 40 minutes  SIGNED:   Burnadette Pop, MD  [facility] 09/11/2023, 10:59 AM Pager [phone]  If 7PM-7AM, please contact night-coverage [email or web address] Password [email or web address]

## 2023-09-11 NOTE — Progress Notes (Addendum)
 1 Day Post-Op Subjective: Darlene Vazquez. Pt feeling well with no specific complaints. Reviewed case and plan in detail.   Objective: Vital signs in last 24 hours: Temp:  [97.7 F (36.5 C)-99.4 F (37.4 C)] 97.7 F (36.5 C) (03/10 0606) Pulse Rate:  [81-118] 81 (03/10 0606) Resp:  [15-21] 15 (03/10 0606) BP: (99-140)/(64-96) 137/81 (03/10 0606) SpO2:  [94 %-100 %] 100 % (03/10 0606)  Assessment/Plan: #right ureteral stone #UTI  S/p right ureteral stent in place with Dr. Cardell Peach on 09/10/23.  Normothermic with mild leukocytosis today.   Continuing empiric ABX. NGTD on BC. UA consistent with nit (+) UTI, though shows signs of contamination. Considering her hemodynamic stability and that she has never had a fever, there is no active concern for systemic infection. It would be reasonable to transition her to oral treatment.  Ok to discharge once cleared form a medical perspective.   Intake/Output from previous day: 03/09 0701 - 03/10 0700 In: 2233.8 [P.O.:540; I.V.:1493.8; IV Piggyback:200] Out: 0   Intake/Output this shift: Total I/O In: 120 [P.O.:120] Out: -   Physical Exam:  General: Alert and oriented CV: No cyanosis Lungs: equal chest rise   Lab Results: Recent Labs    09/09/23 1859 09/10/23 1402 09/11/23 0428  HGB 12.2 11.2* 10.7*  HCT 37.7 35.9* 34.3*   BMET Recent Labs    09/10/23 1402 09/11/23 0428  NA 138 139  K 3.6 3.5  CL 103 106  CO2 22 23  GLUCOSE 149* 159*  BUN 9 10  CREATININE 0.83 0.79  CALCIUM 8.7* 8.7*     Studies/Results: DG C-Arm 1-60 Min-No Report Result Date: 09/10/2023 Fluoroscopy was utilized by the requesting physician.  No radiographic interpretation.   CT ABDOMEN PELVIS W CONTRAST Result Date: 09/09/2023 CLINICAL DATA:  Provided history: Biliary obstruction suspected (Ped 0-17y) ruq pain, chole? EXAM: CT ABDOMEN AND PELVIS WITH CONTRAST TECHNIQUE: Multidetector CT imaging of the abdomen and pelvis was performed using the standard  protocol following bolus administration of intravenous contrast. RADIATION DOSE REDUCTION: This exam was performed according to the departmental dose-optimization program which includes automated exposure control, adjustment of the mA and/or kV according to patient size and/or use of iterative reconstruction technique. CONTRAST:  OMNIPAQUE IOHEXOL 300 MG/ML  SOLN COMPARISON:  CT 09/12/2018 FINDINGS: Lower chest: Subsegmental atelectasis in the right lower lobe. No pleural effusion. Hepatobiliary: No focal liver abnormality is seen. No gallstones, gallbladder wall thickening, or biliary dilatation. Pancreas: No ductal dilatation or inflammation. Spleen: Normal in size without focal abnormality. Adrenals/Urinary Tract: No adrenal nodule. Obstructing 6 x 7 mm stone in the distal right ureter (at the level of the upper sacrum) with moderate proximal hydroureteronephrosis. There is mild ureteral enhancement with perinephric and ureteric fat stranding the left kidney is unremarkable. Unremarkable urinary bladder. Stomach/Bowel: Colonic diverticulosis without diverticulitis. No bowel obstruction or inflammation. The appendix is normal. Vascular/Lymphatic: No acute vascular findings. Normal caliber abdominal aorta. Portal vein is patent. No abdominopelvic adenopathy. Reproductive: Lobulated uterus with multiple fibroids. Other: No free air or ascites. Moderate fat containing umbilical hernia, no bowel involvement. Musculoskeletal: There are no acute or suspicious osseous abnormalities. IMPRESSION: 1. Obstructing 6 x 7 mm stone in the distal right ureter with moderate proximal hydroureteronephrosis. 2. Colonic diverticulosis without diverticulitis. 3. Fibroid uterus. 4. Moderate fat containing umbilical hernia. Electronically Signed   By: Narda Rutherford M.D.   On: 09/09/2023 21:41      LOS: 2 days   Elmon Kirschner, NP [facility] Pager: [phone]  [phone]  09/11/2023, 9:53 AM   F/u urine  culture. Continue abx. Will need outpatient course of abx. Arranged f/u with urology.  Matt R. Caldwell Kronenberger MD [facility]  Pager: [phone]

## 2023-09-11 NOTE — Assessment & Plan Note (Signed)
·   Replacing with potassium chloride °· Evaluating for concurrent hypomagnesemia  °· Monitoring potassium levels with serial chemistries. ° °

## 2023-09-11 NOTE — Plan of Care (Signed)
  Problem: Education: Goal: Knowledge of General Education information will improve Description: Including pain rating scale, medication(s)/side effects and non-pharmacologic comfort measures Outcome: Adequate for Discharge   Problem: Health Behavior/Discharge Planning: Goal: Ability to manage health-related needs will improve Outcome: Adequate for Discharge   Problem: Clinical Measurements: Goal: Ability to maintain clinical measurements within normal limits will improve Outcome: Adequate for Discharge Goal: Will remain free from infection Outcome: Adequate for Discharge Goal: Diagnostic test results will improve Outcome: Adequate for Discharge Goal: Respiratory complications will improve Outcome: Adequate for Discharge Goal: Cardiovascular complication will be avoided Outcome: Adequate for Discharge   Problem: Activity: Goal: Risk for activity intolerance will decrease Outcome: Adequate for Discharge   Problem: Nutrition: Goal: Adequate nutrition will be maintained Outcome: Adequate for Discharge   Problem: Coping: Goal: Level of anxiety will decrease Outcome: Adequate for Discharge   Problem: Elimination: Goal: Will not experience complications related to bowel motility Outcome: Adequate for Discharge Goal: Will not experience complications related to urinary retention Outcome: Adequate for Discharge   Problem: Pain Managment: Goal: General experience of comfort will improve and/or be controlled Outcome: Adequate for Discharge   Problem: Safety: Goal: Ability to remain free from injury will improve Outcome: Adequate for Discharge

## 2023-09-11 NOTE — Assessment & Plan Note (Signed)
 Daily proton pump inhibitor Patient reports longstanding dyspepsia despite PPI use.  Advised outpatient follow-up with gastroenterology.

## 2023-09-11 NOTE — Progress Notes (Signed)
   09/11/23 0950  TOC Brief Assessment  Insurance and Status Reviewed  Patient has primary care physician Yes  Home environment has been reviewed resides in private residence with spouse  Prior level of function: Independent  Prior/Current Home Services No current home services  Social Drivers of Health Review SDOH reviewed no interventions necessary  Readmission risk has been reviewed Yes  Transition of care needs no transition of care needs at this time

## 2023-09-11 NOTE — Assessment & Plan Note (Signed)
.   Continuing home regimen of lipid lowering therapy.  

## 2023-09-13 ENCOUNTER — Other Ambulatory Visit: Payer: Self-pay | Admitting: Urology

## 2023-09-14 ENCOUNTER — Encounter (HOSPITAL_COMMUNITY): Payer: Self-pay | Admitting: Urology

## 2023-09-14 ENCOUNTER — Other Ambulatory Visit: Payer: Self-pay

## 2023-09-14 NOTE — Progress Notes (Signed)
 COVID Vaccine Completed:  Date of COVID positive in last 90 days:  No  PCP - Wayland Denis, PA-C Cardiologist - N/A  Chest x-ray -  N/A EKG - 09-11-23 Stress Test -  N/A ECHO -  N/A Cardiac Cath -  N/A Pacemaker/ICD device last checked: Spinal Cord Stimulator: N/A  Bowel Prep -  N/A  Sleep Study -  N/A CPAP -   Fasting Blood Sugar -  N/A Checks Blood Sugar _____ times a day  Last dose of GLP1 agonist-  N/A GLP1 instructions:  Hold 7 days before surgery    Last dose of SGLT-2 inhibitors-  N/A SGLT-2 instructions:  Hold 3 days before surgery   Blood Thinner Instructions:   N/A Aspirin Instructions: Last Dose:  Activity level:  Can go up a flight of stairs and perform activities of daily living without stopping and without symptoms of chest pain or shortness of breath.  Anesthesia review:  N/A  Patient denies shortness of breath, fever, cough and chest pain at PAT appointment (completed over the phone)  Patient verbalized understanding of instructions that were given to them at the PAT appointment. Patient was also instructed that they will need to review over the PAT instructions again at home before surgery.

## 2023-09-15 LAB — AEROBIC/ANAEROBIC CULTURE W GRAM STAIN (SURGICAL/DEEP WOUND)

## 2023-09-15 LAB — CULTURE, BLOOD (ROUTINE X 2)

## 2023-09-20 DIAGNOSIS — N390 Urinary tract infection, site not specified: Secondary | ICD-10-CM | POA: Diagnosis not present

## 2023-09-20 DIAGNOSIS — D72829 Elevated white blood cell count, unspecified: Secondary | ICD-10-CM | POA: Diagnosis not present

## 2023-09-21 NOTE — H&P (Signed)
 Urology Preoperative H&P   Chief Complaint: Right ureteral stone  History of Present Illness: Darlene Vazquez is a 38 y.o. female with a right ureteral stone. She presented on 09/09/2023 and found to have a right ureteral stone with sign of infection. She underwent right stent placement. Urine culture resulted rare E coli. She completed a course of keflex. She denies fevers, chills, dysuria. She is here today for definitive stone treatment of her 6mm distal right ureteral stone.  Past Medical History:  Diagnosis Date   Family history of adverse reaction to anesthesia    Mom N&V   GERD (gastroesophageal reflux disease)    History of kidney stones    PONV (postoperative nausea and vomiting)    Hard to arouse    Past Surgical History:  Procedure Laterality Date   CYSTOSCOPY W/ URETERAL STENT PLACEMENT Right 09/10/2023   Procedure: CYSTOSCOPY, WITH RETROGRADE PYELOGRAM AND URETERAL STENT INSERTION;  Surgeon: Jannifer Hick, MD;  Location: [facility];  Service: Urology;  Laterality: Right;   DIAGNOSTIC LAPAROSCOPY WITH REMOVAL OF ECTOPIC PREGNANCY Right 11/09/2013   Procedure: DIAGNOSTIC LAPAROSCOPY WITH REMOVAL OF ECTOPIC PREGNANCY;  Surgeon: Lazaro Arms, MD;  Location: [facility];  Service: Gynecology;  Laterality: Right;   ESOPHAGOGASTRODUODENOSCOPY N/A 08/12/2014   SLF: 1. No obvious source for epigastric pain identified. 2. Mild gastiritis.    LAPAROSCOPY N/A 11/09/2013   Procedure: LAPAROSCOPY OPERATIVE;  Surgeon: Lazaro Arms, MD;  Location: [facility];  Service: Gynecology;  Laterality: N/A;   UNILATERAL SALPINGECTOMY Right 11/09/2013   Procedure: UNILATERAL SALPINGECTOMY;  Surgeon: Lazaro Arms, MD;  Location: [facility];  Service: Gynecology;  Laterality: Right;    Allergies:  Allergies  Allergen Reactions   Other Hives and Other (See Comments)    Hair dye   Sulfa Antibiotics Hives and Itching    Family History  Problem Relation Age of Onset   Wilson's disease Maternal Grandfather    Colon  cancer Neg Hx    Colon polyps Neg Hx     Social History:  reports that she has never smoked. She has never used smokeless tobacco. She reports that she does not drink alcohol and does not use drugs.  ROS: A complete review of systems was performed.  All systems are negative except for pertinent findings as noted.  Physical Exam:  Vital signs in last 24 hours:   Constitutional:  Alert and oriented, No acute distress Cardiovascular: Regular rate and rhythm Respiratory: Normal respiratory effort, Lungs clear bilaterally GI: Abdomen is soft, nontender, nondistended, no abdominal masses GU: No CVA tenderness Lymphatic: No lymphadenopathy Neurologic: Grossly intact, no focal deficits Psychiatric: Normal mood and affect  Laboratory Data:  No results for input(s): "WBC", "HGB", "HCT", "PLT" in the last 72 hours.  No results for input(s): "NA", "K", "CL", "GLUCOSE", "BUN", "CALCIUM", "CREATININE" in the last 72 hours.  Invalid input(s): "CO3"   No results found for this or any previous visit (from the past 24 hours). No results found for this or any previous visit (from the past 240 hours).  Renal Function: No results for input(s): "CREATININE" in the last 168 hours. Estimated Creatinine Clearance: 97.8 mL/min (by C-G formula based on SCr of 0.79 mg/dL).  Radiologic Imaging: No results found.  I independently reviewed the above imaging studies.  Assessment and Plan Darlene Vazquez is a 38 y.o. female with a 6mm distal right ureteral stone.  -The risks, benefits and alternatives of cystoscopy with right ureteroscopy with laser lithotripsy and  JJ stent placement was discussed with the patient.  Risks include, but are not limited to: bleeding, urinary tract infection, ureteral injury, ureteral stricture disease, chronic pain, urinary symptoms, bladder injury, stent migration, the need for nephrostomy tube placement, MI, CVA, DVT, PE and the inherent risks with general anesthesia.  The  patient voices understanding and wishes to proceed.   ***  Matt R. Akacia Boltz MD 09/21/2023, 3:31 PM  [facility] Pager: [phone]): [phone]

## 2023-09-21 NOTE — Anesthesia Preprocedure Evaluation (Signed)
 Anesthesia Evaluation  Patient identified by MRN, date of birth, ID band Patient awake    Reviewed: Allergy & Precautions, H&P , NPO status , Patient's Chart, lab work & pertinent test results  History of Anesthesia Complications (+) PONV and history of anesthetic complications  Airway Mallampati: II  TM Distance: >3 FB Neck ROM: Full    Dental no notable dental hx.    Pulmonary neg pulmonary ROS   Pulmonary exam normal breath sounds clear to auscultation       Cardiovascular negative cardio ROS Normal cardiovascular exam Rhythm:Regular Rate:Normal     Neuro/Psych negative neurological ROS  negative psych ROS   GI/Hepatic Neg liver ROS,GERD  ,,  Endo/Other  negative endocrine ROS    Renal/GU Renal disease  negative genitourinary   Musculoskeletal negative musculoskeletal ROS (+)    Abdominal   Peds negative pediatric ROS (+)  Hematology negative hematology ROS (+)   Anesthesia Other Findings   Reproductive/Obstetrics negative OB ROS                             Anesthesia Physical Anesthesia Plan  ASA: 2  Anesthesia Plan: General   Post-op Pain Management:    Induction: Intravenous  PONV Risk Score and Plan: 4 or greater and Ondansetron, Dexamethasone, Scopolamine patch - Pre-op, Midazolam and Treatment may vary due to age or medical condition  Airway Management Planned: LMA  Additional Equipment:   Intra-op Plan:   Post-operative Plan: Extubation in OR  Informed Consent: I have reviewed the patients History and Physical, chart, labs and discussed the procedure including the risks, benefits and alternatives for the proposed anesthesia with the patient or authorized representative who has indicated his/her understanding and acceptance.     Dental advisory given  Plan Discussed with: CRNA  Anesthesia Plan Comments:        Anesthesia Quick Evaluation

## 2023-09-22 ENCOUNTER — Encounter (HOSPITAL_COMMUNITY)
Admission: RE | Disposition: A | Discharge status: Home / Self Care | Payer: Self-pay | Source: Home / Self Care | Attending: Urology

## 2023-09-22 ENCOUNTER — Ambulatory Visit (HOSPITAL_COMMUNITY): Payer: Self-pay

## 2023-09-22 ENCOUNTER — Other Ambulatory Visit (HOSPITAL_COMMUNITY): Payer: Self-pay

## 2023-09-22 ENCOUNTER — Ambulatory Visit (HOSPITAL_COMMUNITY)

## 2023-09-22 ENCOUNTER — Ambulatory Visit (HOSPITAL_BASED_OUTPATIENT_CLINIC_OR_DEPARTMENT_OTHER): Payer: Self-pay

## 2023-09-22 ENCOUNTER — Ambulatory Visit (HOSPITAL_COMMUNITY)
Admission: RE | Admit: 2023-09-22 | Discharge: 2023-09-22 | Disposition: A | Discharge status: Home / Self Care | Attending: Urology | Admitting: Urology

## 2023-09-22 DIAGNOSIS — N132 Hydronephrosis with renal and ureteral calculous obstruction: Secondary | ICD-10-CM | POA: Insufficient documentation

## 2023-09-22 DIAGNOSIS — Z01818 Encounter for other preprocedural examination: Secondary | ICD-10-CM

## 2023-09-22 DIAGNOSIS — N201 Calculus of ureter: Secondary | ICD-10-CM | POA: Diagnosis not present

## 2023-09-22 DIAGNOSIS — N138 Other obstructive and reflux uropathy: Secondary | ICD-10-CM

## 2023-09-22 HISTORY — DX: Personal history of urinary calculi: Z87.442

## 2023-09-22 HISTORY — PX: CYSTOSCOPY/URETEROSCOPY/HOLMIUM LASER/STENT PLACEMENT: SHX6546

## 2023-09-22 HISTORY — PX: CYSTOSCOPY W/ RETROGRADES: SHX1426

## 2023-09-22 HISTORY — DX: Nausea with vomiting, unspecified: Z98.890

## 2023-09-22 HISTORY — DX: Family history of other specified conditions: Z84.89

## 2023-09-22 HISTORY — DX: Nausea with vomiting, unspecified: R11.2

## 2023-09-22 LAB — POCT PREGNANCY, URINE: Preg Test, Ur: NEGATIVE

## 2023-09-22 SURGERY — CYSTOSCOPY/URETEROSCOPY/HOLMIUM LASER/STENT PLACEMENT
Anesthesia: General | Laterality: Right

## 2023-09-22 MED ORDER — OXYCODONE HCL 5 MG PO TABS
5.0000 mg | ORAL_TABLET | Freq: Once | ORAL | Status: AC | PRN
  Administered 2023-09-22: 5 mg via ORAL

## 2023-09-22 MED ORDER — DEXAMETHASONE SODIUM PHOSPHATE 10 MG/ML IJ SOLN
INTRAMUSCULAR | Status: DC | PRN
  Administered 2023-09-22: 5 mg via INTRAVENOUS

## 2023-09-22 MED ORDER — OXYCODONE-ACETAMINOPHEN 5-325 MG PO TABS
1.0000 | ORAL_TABLET | ORAL | 0 refills | Status: DC | PRN
  Filled 2023-09-22 (×2): qty 18, 3d supply, fill #0

## 2023-09-22 MED ORDER — ACETAMINOPHEN 10 MG/ML IV SOLN: 1000.0000 mg | Freq: Once | INTRAVENOUS | Status: DC | PRN

## 2023-09-22 MED ORDER — CEFAZOLIN SODIUM-DEXTROSE 2-4 GM/100ML-% IV SOLN
2.0000 g | INTRAVENOUS | Status: AC
Start: 2023-09-22 — End: 2023-09-22
  Administered 2023-09-22: 2 g via INTRAVENOUS
  Filled 2023-09-22: qty 100

## 2023-09-22 MED ORDER — LIDOCAINE HCL URETHRAL/MUCOSAL 2 % EX GEL
CUTANEOUS | Status: AC
  Filled 2023-09-22: qty 5

## 2023-09-22 MED ORDER — DROPERIDOL 2.5 MG/ML IJ SOLN: 0.6250 mg | Freq: Once | INTRAMUSCULAR | Status: DC | PRN

## 2023-09-22 MED ORDER — DEXAMETHASONE SODIUM PHOSPHATE 10 MG/ML IJ SOLN
INTRAMUSCULAR | Status: AC
  Filled 2023-09-22: qty 1

## 2023-09-22 MED ORDER — LACTATED RINGERS IV SOLN: INTRAVENOUS | Status: DC

## 2023-09-22 MED ORDER — PROPOFOL 10 MG/ML IV BOLUS
INTRAVENOUS | Status: AC
  Filled 2023-09-22: qty 20

## 2023-09-22 MED ORDER — DOCUSATE SODIUM 100 MG PO CAPS
100.0000 mg | ORAL_CAPSULE | Freq: Every day | ORAL | 0 refills | Status: DC | PRN
  Filled 2023-09-22: qty 30, 30d supply, fill #0

## 2023-09-22 MED ORDER — FENTANYL CITRATE (PF) 100 MCG/2ML IJ SOLN
INTRAMUSCULAR | Status: DC | PRN
  Administered 2023-09-22 (×2): 25 ug via INTRAVENOUS
  Administered 2023-09-22: 50 ug via INTRAVENOUS

## 2023-09-22 MED ORDER — CHLORHEXIDINE GLUCONATE 0.12 % MT SOLN
15.0000 mL | Freq: Once | OROMUCOSAL | Status: AC
  Administered 2023-09-22: 15 mL via OROMUCOSAL

## 2023-09-22 MED ORDER — STERILE WATER FOR IRRIGATION IR SOLN
Status: DC | PRN
  Administered 2023-09-22: 500 mL

## 2023-09-22 MED ORDER — IOHEXOL 300 MG/ML  SOLN
INTRAMUSCULAR | Status: DC | PRN
  Administered 2023-09-22: 3 mL

## 2023-09-22 MED ORDER — ONDANSETRON HCL 4 MG/2ML IJ SOLN
INTRAMUSCULAR | Status: AC
  Filled 2023-09-22: qty 2

## 2023-09-22 MED ORDER — LIDOCAINE HCL (PF) 2 % IJ SOLN
INTRAMUSCULAR | Status: AC
  Filled 2023-09-22: qty 5

## 2023-09-22 MED ORDER — ONDANSETRON HCL 4 MG/2ML IJ SOLN
INTRAMUSCULAR | Status: DC | PRN
  Administered 2023-09-22: 4 mg via INTRAVENOUS

## 2023-09-22 MED ORDER — ORAL CARE MOUTH RINSE: 15.0000 mL | Freq: Once | OROMUCOSAL | Status: AC

## 2023-09-22 MED ORDER — ACETAMINOPHEN 500 MG PO TABS
1000.0000 mg | ORAL_TABLET | Freq: Once | ORAL | Status: AC
  Administered 2023-09-22: 1000 mg via ORAL
  Filled 2023-09-22: qty 2

## 2023-09-22 MED ORDER — PROPOFOL 10 MG/ML IV BOLUS
INTRAVENOUS | Status: DC | PRN
Start: 2023-09-22 — End: 2023-09-22
  Administered 2023-09-22: 200 mg via INTRAVENOUS

## 2023-09-22 MED ORDER — MIDAZOLAM HCL 5 MG/5ML IJ SOLN
INTRAMUSCULAR | Status: DC | PRN
  Administered 2023-09-22: 2 mg via INTRAVENOUS

## 2023-09-22 MED ORDER — OXYCODONE HCL 5 MG/5ML PO SOLN: 5.0000 mg | Freq: Once | ORAL | Status: AC | PRN

## 2023-09-22 MED ORDER — LIDOCAINE HCL (CARDIAC) PF 100 MG/5ML IV SOSY
PREFILLED_SYRINGE | INTRAVENOUS | Status: DC | PRN
  Administered 2023-09-22: 100 mg via INTRAVENOUS

## 2023-09-22 MED ORDER — SODIUM CHLORIDE 0.9 % IR SOLN
Status: DC | PRN
  Administered 2023-09-22: 3000 mL via INTRAVESICAL

## 2023-09-22 MED ORDER — FENTANYL CITRATE (PF) 100 MCG/2ML IJ SOLN
INTRAMUSCULAR | Status: AC
  Filled 2023-09-22: qty 2

## 2023-09-22 MED ORDER — MIDAZOLAM HCL 2 MG/2ML IJ SOLN
INTRAMUSCULAR | Status: AC
  Filled 2023-09-22: qty 2

## 2023-09-22 MED ORDER — OXYCODONE HCL 5 MG PO TABS
ORAL_TABLET | ORAL | Status: AC
  Filled 2023-09-22: qty 1

## 2023-09-22 MED ORDER — FENTANYL CITRATE PF 50 MCG/ML IJ SOSY: 25.0000 ug | PREFILLED_SYRINGE | INTRAMUSCULAR | Status: DC | PRN

## 2023-09-22 MED ORDER — SCOPOLAMINE 1 MG/3DAYS TD PT72
1.0000 | MEDICATED_PATCH | TRANSDERMAL | Status: DC
  Administered 2023-09-22: 1.5 mg via TRANSDERMAL
  Filled 2023-09-22: qty 1

## 2023-09-22 SURGICAL SUPPLY — 27 items
BAG URO CATCHER STRL LF (MISCELLANEOUS) ×2 IMPLANT
BASKET ZERO TIP NITINOL 2.4FR (BASKET) IMPLANT
BENZOIN TINCTURE PRP APPL 2/3 (GAUZE/BANDAGES/DRESSINGS) IMPLANT
CATH URETERAL DUAL LUMEN 10F (MISCELLANEOUS) IMPLANT
CATH URETL OPEN 5X70 (CATHETERS) ×2 IMPLANT
CATH URETL OPEN END 6FR 70 (CATHETERS) IMPLANT
CLOTH BEACON ORANGE TIMEOUT ST (SAFETY) ×2 IMPLANT
DRSG TEGADERM 2-3/8X2-3/4 SM (GAUZE/BANDAGES/DRESSINGS) IMPLANT
FIBER LASER MOSES 200 DFL (Laser) IMPLANT
GLOVE BIO SURGEON STRL SZ7 (GLOVE) ×2 IMPLANT
GLOVE BIOGEL M 7.0 STRL (GLOVE) ×2 IMPLANT
GLOVE SURG LX STRL 7.5 STRW (GLOVE) ×2 IMPLANT
GOWN STRL REUS W/ TWL XL LVL3 (GOWN DISPOSABLE) ×2 IMPLANT
GUIDEWIRE STR DUAL SENSOR (WIRE) ×4 IMPLANT
GUIDEWIRE ZIPWRE .038 STRAIGHT (WIRE) IMPLANT
KIT TURNOVER KIT A (KITS) IMPLANT
LASER FIB FLEXIVA PULSE ID 365 (Laser) IMPLANT
MANIFOLD NEPTUNE II (INSTRUMENTS) ×2 IMPLANT
NS IRRIG 1000ML POUR BTL (IV SOLUTION) IMPLANT
PACK CYSTO (CUSTOM PROCEDURE TRAY) ×2 IMPLANT
PAD PREP 24X48 CUFFED NSTRL (MISCELLANEOUS) ×2 IMPLANT
SHEATH DILATOR SET 8/10 (MISCELLANEOUS) IMPLANT
SHEATH NAVIGATOR HD 12/14X46 (SHEATH) IMPLANT
STENT URET 6FRX24 CONTOUR (STENTS) IMPLANT
TRACTIP FLEXIVA PULS ID 200XHI (Laser) IMPLANT
TUBING CONNECTING 10 (TUBING) ×2 IMPLANT
TUBING UROLOGY SET (TUBING) ×2 IMPLANT

## 2023-09-22 NOTE — Anesthesia Postprocedure Evaluation (Signed)
 Anesthesia Post Note  Patient: Darlene Vazquez  Procedure(s) Performed: CYSTOSCOPY/URETEROSCOPY/HOLMIUM LASER/STENT PLACEMENT (Right) CYSTOSCOPY, WITH RETROGRADE PYELOGRAM (Right)     Patient location during evaluation: PACU Anesthesia Type: General Level of consciousness: awake and alert Pain management: pain level controlled Vital Signs Assessment: post-procedure vital signs reviewed and stable Respiratory status: spontaneous breathing, nonlabored ventilation, respiratory function stable and patient connected to nasal cannula oxygen Cardiovascular status: blood pressure returned to baseline and stable Postop Assessment: no apparent nausea or vomiting Anesthetic complications: no   No notable events documented.  Last Vitals:  Vitals:   09/22/23 0930 09/22/23 0932  BP:  (!) 148/96  Pulse: 90 99  Resp: 15 16  Temp:    SpO2: 100% 100%    Last Pain:  Vitals:   09/22/23 0932  TempSrc:   PainSc: 2                  [place]

## 2023-09-22 NOTE — Discharge Instructions (Signed)
 [facility] [phone] Post Ureteroscopy With or Without Stent Instructions  Definitions:  Ureter: The duct that transports urine from the kidney to the bladder. Stent:   A plastic hollow tube that is placed into the ureter, from the kidney to the bladder to prevent the ureter from swelling shut.  GENERAL INSTRUCTIONS:  Despite the fact that no skin incisions were used, the area around the ureter and bladder is raw and irritated. The stent is a foreign body which will further irritate the bladder wall. This irritation is manifested by increased frequency of urination, both day and night, and by an increase in the urge to urinate. In some, the urge to urinate is present almost always. Sometimes the urge is strong enough that you may not be able to stop yourself from urinating. The only real cure is to remove the stent and then give time for the bladder wall to heal which can't be done until the danger of the ureter swelling shut has passed, which varies.  You may see some blood in your urine while the stent is in place and a few days afterwards. Do not be alarmed, even if the urine was clear for a while. Get off your feet and drink lots of fluids until clearing occurs. If you start to pass clots or don't improve, call us.  DIET: You may return to your normal diet immediately. Because of the raw surface of your bladder, alcohol, spicy foods, acid type foods and drinks with caffeine may cause irritation or frequency and should be used in moderation. To keep your urine flowing freely and to avoid constipation, drink plenty of fluids during the day ( 8-10 glasses ). Tip: Avoid cranberry juice because it is very acidic.  ACTIVITY: Your physical activity doesn't need to be restricted. However, if you are very active, you may see some blood in your urine. We suggest that you reduce your activity under these circumstances until the bleeding has stopped.  BOWELS: It is important to  keep your bowels regular during the postoperative period. Straining with bowel movements can cause bleeding. A bowel movement every other day is reasonable. Use a mild laxative if needed, such as Milk of Magnesia 2-3 tablespoons, or 2 Dulcolax tablets. Call if you continue to have problems. If you have been taking narcotics for pain, before, during or after your surgery, you may be constipated. Take a laxative if necessary.   MEDICATION: You should resume your pre-surgery medications unless told not to. In addition you will often be given an antibiotic to prevent infection. These should be taken as prescribed until the bottles are finished unless you are having an unusual reaction to one of the drugs.  PROBLEMS YOU SHOULD REPORT TO [place]: Fevers over 100.5 Fahrenheit. Heavy bleeding, or clots ( See above notes about blood in urine ). Inability to urinate. Drug reactions ( hives, rash, nausea, vomiting, diarrhea ). Severe burning or pain with urination that is not improving.  FOLLOW-UP: You will need a follow-up appointment to monitor your progress. Call for this appointment at the number listed above. Usually the first appointment will be about three to fourteen days after your surgery.  You have a stent in place and may remove it on Monday AM by pulling on attached string.

## 2023-09-22 NOTE — Anesthesia Procedure Notes (Signed)
 Procedure Name: LMA Insertion Date/Time: 09/22/2023 7:45 AM  Performed by: Sampson Goon, CRNAPre-anesthesia Checklist: Patient identified, Emergency Drugs available, Suction available and Patient being monitored Patient Re-evaluated:Patient Re-evaluated prior to induction Oxygen Delivery Method: Circle System Utilized Preoxygenation: Pre-oxygenation with 100% oxygen Induction Type: IV induction LMA: LMA inserted LMA Size: 4.0 Number of attempts: 1 Airway Equipment and Method: Bite block Placement Confirmation: positive ETCO2 Tube secured with: Tape Dental Injury: Teeth and Oropharynx as per pre-operative assessment

## 2023-09-22 NOTE — Transfer of Care (Signed)
 Immediate Anesthesia Transfer of Care Note  Patient: Darlene Vazquez  Procedure(s) Performed: CYSTOSCOPY/URETEROSCOPY/HOLMIUM LASER/STENT PLACEMENT (Right) CYSTOSCOPY, WITH RETROGRADE PYELOGRAM (Right)  Patient Location: PACU  Anesthesia Type:General  Level of Consciousness: awake  Airway & Oxygen Therapy: Patient Spontanous Breathing  Post-op Assessment: Report given to RN and Post -op Vital signs reviewed and stable  Post vital signs: Reviewed and stable  Last Vitals:  Vitals Value Taken Time  BP 143/93 09/22/23 0847  Temp    Pulse 123 09/22/23 0850  Resp 20 09/22/23 0850  SpO2 97 % 09/22/23 0850  Vitals shown include unfiled device data.  Last Pain:  Vitals:   09/22/23 0631  TempSrc: Oral  PainSc:          Complications: No notable events documented.

## 2023-09-22 NOTE — Op Note (Signed)
 Operative Note  Preoperative diagnosis:  1.  Right ureteral stone  Postoperative diagnosis: 1.  Right ureteral stone  Procedure(s): 1.  Cystoscopy 2. Right ureteroscopy with laser lithotripsy and basket extraction of stones 3. Right retrograde pyelogram 4. Right ureteral stent placement 5. Fluoroscopy with intraoperative interpretation  Surgeon: Jettie Pagan, MD  Assistants:  None  Anesthesia:  General  Complications:  None  EBL:  Minimal  Specimens: 1. Stones for stone analysis (to be done at [facility])  Drains/Catheters: 1.  Right 6Fr x 24cm ureteral stent with tether string  Intraoperative findings:   Cystoscopy demonstrated no suspicious bladder lesions. Right ureteroscopy demonstrated an impacted 8mm stone in the left mid-ureter successfully fragmented and all fragments extracted. Flexible ureteroscopy revealed no stones in the kidney or proximal ureter following the case. She did have a slightly narrowed caliber distal right ureter that did accommodate the scopes. Right retrograde pyelogram revealed moderate right hydronephrosis with no extravasation of contrast and no filling defects. Successful right ureteral stent placement.  Indication:  Darlene Vazquez is a 38 y.o. female with a right ureteral stone here for definitive treatment.  Description of procedure: After informed consent was obtained from the patient, the patient was identified and taken to the operating room and placed in the supine position.  General anesthesia was administered as well as perioperative IV antibiotics.  At the beginning of the case, a time-out was performed to properly identify the patient, the surgery to be performed, and the surgical site.  Sequential compression devices were applied to the lower extremities at the beginning of the case for DVT prophylaxis.  The patient was then placed in the dorsal lithotomy supine position, prepped and draped in sterile fashion.  A systematic  evaluation of the bladder revealed no evidence of any suspicious bladder lesions.  Ureteral orifices were in normal position.    The distal aspect of the ureteral stent was seen protruding from the right ureteral orifice.  We then used the alligator-tooth forceps and grasped the distal end of the ureteral stent and brought it out the urethral meatus while watching the proximal coil straighten out nicely on fluoroscopy. Through the ureteral stent, we then passed a 0.038 sensor wire up to the level of the renal pelvis.  The ureteral stent was then removed, leaving the sensor wire up the right ureter.    Under cystoscopic and flouroscopic guidance, we cannulated the right ureteral orifice with a 5-French open-ended ureteral catheter and a gentle retrograde pyelogram was performed, revealing slightly decreased caliber ureter without any filling defects. There was moderate right hydronephrosis of the collecting system. A 0.038 sensor wire was then passed up to the level of the renal pelvis and secured to the drape as a safety wire. The ureteral catheter and cystoscope were removed, leaving the safety wire in place.   A semi-rigid ureteroscope was passed alongside the wire up the distal ureter which appeared slightly narrowed in caliber. I encountered the 8mm stone in the mid ureter which was impacted. Using the 242 micron holmium laser fiber, the stone was fragmented into at least 6 pieces and then basket extracted without trauma. The ureter was surveyed and there was no trauma or further stones. A second 0.038 sensor wire was passed under direct vision and the semirigid scope was removed. The flexible ureteroscope was advanced into the collecting system via the access sheath. The collecting system was inspected. There were no other stones. With the ureteroscope in the kidney, a gentle pyelogram was  performed to delineate the calyceal system and we evaluated the calyces systematically. We encountered a no further  stones.   We then withdrew the ureteroscope back down the ureter noting no evidence of any stones along the course of the ureter.  Prior to removing the ureteroscope, we did pass the Glidewire back up to the ureter to the renal pelvis.  Once the ureteroscope was removed, we then used the Glidewire under fluoroscopic guidance and passed up a 6-French x 24 cm double-pigtail ureteral stent up the ureter, making sure that the proximal and distal ends coiled within the kidney and bladder respectively.  Note that we left a long tether string attached to the distal end of the ureteral stent and it exited the urethral meatus and was secured to the inner thigh with a tegaderm adhesive.  The cystoscope was then advanced back into the bladder under vision.  We were able to see the distal stent coiling nicely within the bladder.  The bladder was then emptied with irrigation solution.  The cystoscope was then removed.    The patient tolerated the procedure well and there was no complication. Patient was awoken from anesthesia and taken to the recovery room in stable condition. I was present and scrubbed for the entirety of the case.  Plan:  Patient will be discharged home and will remove stent in 3 days by pulling on attached string.   Matt R. Maxi Rodas MD [facility]  Pager: [phone]

## 2023-09-23 ENCOUNTER — Encounter (HOSPITAL_COMMUNITY): Payer: Self-pay | Admitting: Urology

## 2023-09-25 ENCOUNTER — Other Ambulatory Visit: Payer: Self-pay

## 2023-09-25 ENCOUNTER — Emergency Department (HOSPITAL_COMMUNITY)

## 2023-09-25 ENCOUNTER — Observation Stay (HOSPITAL_COMMUNITY)
Admission: EM | Admit: 2023-09-25 | Discharge: 2023-09-27 | Disposition: A | Discharge status: Home / Self Care | Attending: Family Medicine | Admitting: Family Medicine

## 2023-09-25 ENCOUNTER — Other Ambulatory Visit (HOSPITAL_COMMUNITY): Payer: Self-pay

## 2023-09-25 ENCOUNTER — Encounter (HOSPITAL_COMMUNITY): Payer: Self-pay | Admitting: Emergency Medicine

## 2023-09-25 DIAGNOSIS — K5900 Constipation, unspecified: Secondary | ICD-10-CM | POA: Insufficient documentation

## 2023-09-25 DIAGNOSIS — K429 Umbilical hernia without obstruction or gangrene: Secondary | ICD-10-CM | POA: Diagnosis not present

## 2023-09-25 DIAGNOSIS — E46 Unspecified protein-calorie malnutrition: Secondary | ICD-10-CM | POA: Insufficient documentation

## 2023-09-25 DIAGNOSIS — N12 Tubulo-interstitial nephritis, not specified as acute or chronic: Principal | ICD-10-CM

## 2023-09-25 DIAGNOSIS — N390 Urinary tract infection, site not specified: Secondary | ICD-10-CM | POA: Diagnosis not present

## 2023-09-25 DIAGNOSIS — N10 Acute tubulo-interstitial nephritis: Secondary | ICD-10-CM | POA: Diagnosis present

## 2023-09-25 DIAGNOSIS — Z79899 Other long term (current) drug therapy: Secondary | ICD-10-CM | POA: Insufficient documentation

## 2023-09-25 DIAGNOSIS — E782 Mixed hyperlipidemia: Secondary | ICD-10-CM | POA: Diagnosis not present

## 2023-09-25 DIAGNOSIS — E66812 Obesity, class 2: Secondary | ICD-10-CM | POA: Diagnosis present

## 2023-09-25 DIAGNOSIS — R000 Tachycardia, unspecified: Secondary | ICD-10-CM | POA: Diagnosis not present

## 2023-09-25 DIAGNOSIS — Z0389 Encounter for observation for other suspected diseases and conditions ruled out: Secondary | ICD-10-CM | POA: Diagnosis not present

## 2023-09-25 DIAGNOSIS — K5730 Diverticulosis of large intestine without perforation or abscess without bleeding: Secondary | ICD-10-CM | POA: Diagnosis not present

## 2023-09-25 DIAGNOSIS — K2970 Gastritis, unspecified, without bleeding: Secondary | ICD-10-CM | POA: Diagnosis not present

## 2023-09-25 DIAGNOSIS — E8809 Other disorders of plasma-protein metabolism, not elsewhere classified: Secondary | ICD-10-CM | POA: Insufficient documentation

## 2023-09-25 DIAGNOSIS — K2950 Unspecified chronic gastritis without bleeding: Secondary | ICD-10-CM | POA: Diagnosis present

## 2023-09-25 DIAGNOSIS — R509 Fever, unspecified: Secondary | ICD-10-CM | POA: Diagnosis present

## 2023-09-25 DIAGNOSIS — N1330 Unspecified hydronephrosis: Secondary | ICD-10-CM | POA: Diagnosis not present

## 2023-09-25 DIAGNOSIS — D259 Leiomyoma of uterus, unspecified: Secondary | ICD-10-CM | POA: Diagnosis not present

## 2023-09-25 DIAGNOSIS — E876 Hypokalemia: Secondary | ICD-10-CM | POA: Diagnosis not present

## 2023-09-25 LAB — COMPREHENSIVE METABOLIC PANEL
ALT: 15 U/L (ref 0–44)
AST: 14 U/L — ABNORMAL LOW (ref 15–41)
Albumin: 3.3 g/dL — ABNORMAL LOW (ref 3.5–5.0)
Alkaline Phosphatase: 78 U/L (ref 38–126)
Anion gap: 14 (ref 5–15)
BUN: 8 mg/dL (ref 6–20)
CO2: 23 mmol/L (ref 22–32)
Calcium: 8.7 mg/dL — ABNORMAL LOW (ref 8.9–10.3)
Chloride: 98 mmol/L (ref 98–111)
Creatinine, Ser: 0.95 mg/dL (ref 0.44–1.00)
GFR, Estimated: >60 mL/min (ref >60)
Glucose, Bld: 149 mg/dL — ABNORMAL HIGH (ref 70–99)
Potassium: 2.7 mmol/L — CL (ref 3.5–5.1)
Sodium: 135 mmol/L (ref 135–145)
Total Bilirubin: 0.7 mg/dL (ref 0.0–1.2)
Total Protein: 7.3 g/dL (ref 6.5–8.1)

## 2023-09-25 LAB — CBC WITH DIFFERENTIAL/PLATELET
Abs Immature Granulocytes: 0.02 10*3/uL (ref 0.00–0.07)
Basophils Absolute: 0 10*3/uL (ref 0.0–0.1)
Basophils Relative: 0 %
Eosinophils Absolute: 0.1 10*3/uL (ref 0.0–0.5)
Eosinophils Relative: 1 %
HCT: 33.9 % — ABNORMAL LOW (ref 36.0–46.0)
Hemoglobin: 10.8 g/dL — ABNORMAL LOW (ref 12.0–15.0)
Immature Granulocytes: 0 %
Lymphocytes Relative: 18 %
Lymphs Abs: 1.6 10*3/uL (ref 0.7–4.0)
MCH: 26.5 pg (ref 26.0–34.0)
MCHC: 31.9 g/dL (ref 30.0–36.0)
MCV: 83.3 fL (ref 80.0–100.0)
Monocytes Absolute: 1 10*3/uL (ref 0.1–1.0)
Monocytes Relative: 12 %
Neutro Abs: 6.1 10*3/uL (ref 1.7–7.7)
Neutrophils Relative %: 69 %
Platelets: 330 10*3/uL (ref 150–400)
RBC: 4.07 MIL/uL (ref 3.87–5.11)
RDW: 15.2 % (ref 11.5–15.5)
WBC: 8.9 10*3/uL (ref 4.0–10.5)
nRBC: 0 % (ref 0.0–0.2)

## 2023-09-25 LAB — PROTIME-INR
INR: 1.1 (ref 0.8–1.2)
Prothrombin Time: 14 s (ref 11.4–15.2)

## 2023-09-25 LAB — APTT: aPTT: 34 s (ref 24–36)

## 2023-09-25 LAB — LACTIC ACID, PLASMA: Lactic Acid, Venous: 1.1 mmol/L (ref 0.5–1.9)

## 2023-09-25 MED ORDER — SODIUM CHLORIDE 0.9 % IV SOLN
2.0000 g | Freq: Once | INTRAVENOUS | Status: AC
  Administered 2023-09-25: 2 g via INTRAVENOUS
  Filled 2023-09-25: qty 12.5

## 2023-09-25 MED ORDER — LACTATED RINGERS IV SOLN: INTRAVENOUS | Status: DC

## 2023-09-25 MED ORDER — ACETAMINOPHEN 500 MG PO TABS
1000.0000 mg | ORAL_TABLET | Freq: Once | ORAL | Status: AC
  Administered 2023-09-25: 1000 mg via ORAL
  Filled 2023-09-25: qty 2

## 2023-09-25 MED ORDER — CEPHALEXIN 500 MG PO CAPS
500.0000 mg | ORAL_CAPSULE | Freq: Two times a day (BID) | ORAL | 0 refills | Status: DC
  Filled 2023-09-25: qty 14, 7d supply, fill #0

## 2023-09-25 MED ORDER — LACTATED RINGERS IV BOLUS (SEPSIS)
1000.0000 mL | Freq: Once | INTRAVENOUS | Status: AC
  Administered 2023-09-25: 1000 mL via INTRAVENOUS

## 2023-09-25 NOTE — ED Notes (Signed)
Labs and blood cultures collected.

## 2023-09-25 NOTE — ED Provider Notes (Signed)
 [place]MENT AT [facility] Provider Note   CSN: [id number] Arrival date & time: 09/25/23  2223     History  Chief Complaint  Patient presents with   Fever    Darlene Vazquez is a 38 y.o. female with past medical history significant for GERD, recent hospital admission secondary to sepsis, UTI with 8 mm kidney stone on the right who is status post stent placement and laser of stone.  With fever, pain, elevated heart rate, ongoing dysuria after removing the stent at home as she was directed by the urologist today.  Procedure with Dr. Cardell Peach with urology.  She denies any nausea, vomiting.   Fever      Home Medications Prior to Admission medications   Medication Sig Start Date End Date Taking? Authorizing Provider  atorvastatin (LIPITOR) 20 MG tablet Take 1 tablet (20 mg total) by mouth every evening. 08/25/23     cephALEXin (KEFLEX) 500 MG capsule Take 1 capsule (500 mg total) by mouth 2 (two) times daily. 09/25/23     docusate sodium (COLACE) 100 MG capsule Take 1 capsule (100 mg total) by mouth daily as needed for up to 30 doses. 09/22/23   Jannifer Hick, MD  linaclotide [facility]) 72 MCG capsule Take 1 capsule (72 mcg total) by mouth daily. 07/12/23     oxyCODONE-acetaminophen (PERCOCET) 5-325 MG tablet Take 1 tablet by mouth every 4 (four) hours as needed for up to 18 doses for severe pain (pain score 7-10). 09/22/23   Jannifer Hick, MD  pantoprazole (PROTONIX) 40 MG tablet Take 1 tablet (40 mg total) by mouth daily. Patient taking differently: Take 40 mg by mouth daily before breakfast. 07/12/23     Prucalopride Succinate (MOTEGRITY) 2 MG TABS Take 2 mg by mouth daily. Patient not taking: Reported on 09/10/2023 09/12/18   West Bali, MD  TYLENOL 500 MG tablet Take 500-1,000 mg by mouth every 8 (eight) hours as needed for mild pain (pain score 1-3) or headache.    [provider]      Allergies    Other and Sulfa antibiotics    Review of Systems    Review of Systems  Constitutional:  Positive for fever.  All other systems reviewed and are negative.   Physical Exam Updated Vital Signs BP (!) 158/101   Pulse (!) 129   Temp 99.2 F (37.3 C) (Oral)   Resp 16   Ht 5\' 2"  (1.575 m)   Wt 90.7 kg   LMP 08/30/2023 (Approximate)   SpO2 98%   BMI 36.57 kg/m  Physical Exam Vitals and nursing note reviewed.  Constitutional:      General: She is not in acute distress.    Appearance: Normal appearance. She is ill-appearing and diaphoretic.  HENT:     Head: Normocephalic and atraumatic.  Eyes:     General:        Right eye: No discharge.        Left eye: No discharge.  Cardiovascular:     Rate and Rhythm: Regular rhythm. Tachycardia present.     Heart sounds: No murmur heard.    No friction rub. No gallop.  Pulmonary:     Effort: Pulmonary effort is normal.     Breath sounds: Normal breath sounds.  Abdominal:     General: Bowel sounds are normal.     Palpations: Abdomen is soft.     Comments: Focal tenderness on right flank, rebound, rigidity, guarding  Skin:  General: Skin is warm.     Capillary Refill: Capillary refill takes less than 2 seconds.  Neurological:     Mental Status: She is alert and oriented to person, place, and time.  Psychiatric:        Mood and Affect: Mood normal.        Behavior: Behavior normal.     ED Results / Procedures / Treatments   Labs (all labs ordered are listed, but only abnormal results are displayed) Labs Reviewed  CBC WITH DIFFERENTIAL/PLATELET - Abnormal; Notable for the following components:      Result Value   Hemoglobin 10.8 (*)    HCT 33.9 (*)    All other components within normal limits  RESP PANEL BY RT-PCR (RSV, FLU A&B, COVID)  RVPGX2  CULTURE, BLOOD (ROUTINE X 2)  CULTURE, BLOOD (ROUTINE X 2)  LACTIC ACID, PLASMA  LACTIC ACID, PLASMA  COMPREHENSIVE METABOLIC PANEL  PROTIME-INR  APTT  URINALYSIS, W/ REFLEX TO CULTURE (INFECTION SUSPECTED)  POC URINE PREG, ED     EKG EKG Interpretation Date/Time:  Monday September 25 2023 22:40:19 EDT Ventricular Rate:  135 PR Interval:  142 QRS Duration:  93 QT Interval:  294 QTC Calculation: 441 R Axis:   2  Text Interpretation: Sinus tachycardia RSR' in V1 or V2, right VCD or RVH Nonspecific T abnormalities, inferior leads Confirmed by Gloris Manchester [phone]) on 09/25/2023 10:42:01 PM  Radiology DG Chest Port 1 View Result Date: 09/25/2023 CLINICAL DATA:  Question of sepsis to evaluate for abnormality. EXAM: PORTABLE CHEST 1 VIEW COMPARISON:  01/05/2019 FINDINGS: The heart size and mediastinal contours are within normal limits. Both lungs are clear. The visualized skeletal structures are unremarkable. IMPRESSION: No active disease. Electronically Signed   By: Burman Nieves M.D.   On: 09/25/2023 23:04    Procedures Procedures    Medications Ordered in ED Medications  lactated ringers infusion (has no administration in time range)  lactated ringers bolus 1,000 mL (has no administration in time range)    And  lactated ringers bolus 1,000 mL (has no administration in time range)    And  lactated ringers bolus 1,000 mL (has no administration in time range)  ceFEPIme (MAXIPIME) 2 g in sodium chloride 0.9 % 100 mL IVPB (2 g Intravenous New Bag/Given 09/25/23 2304)  acetaminophen (TYLENOL) tablet 1,000 mg (1,000 mg Oral Given 09/25/23 2304)    ED Course/ Medical Decision Making/ A&P                                 Medical Decision Making Amount and/or Complexity of Data Reviewed Labs: ordered. Radiology: ordered.  Risk Prescription drug management.    This patient is a 38 y.o. female who presents to the ED for concern of flank pain, postop fever, this involves an extensive number of treatment options, and is a complaint that carries with it a high risk of complications and morbidity. The emergent differential diagnosis prior to evaluation includes, but is not limited to,  AAA, renal vascular thrombosis,  mesenteric ischemia, pyelonephritis, nephrolithiasis, cystitis, biliary colic, pancreatitis, PUD, appendicitis, diverticulitis, bowel obstruction --most suspicious of ongoing urinary tract infection, pyelonephritis, versus other complication related to her recent surgical procedure.. This is not an exhaustive differential.   Past Medical History / Co-morbidities / Social History: GERD, recent hospital admission secondary to sepsis, UTI with 8 mm kidney stone on the right who is status post stent placement and  laser of stone  Additional history: Chart reviewed. Pertinent results include: Extensively reviewed lab work, imaging from her recent hospital admission for sepsis secondary to kidney stone, and recent urologic procedure.  Physical Exam: Physical exam performed. The pertinent findings include: Hypertension, blood pressure 158/101, elevated temperature at 99.2, tachycardic pulse 137, slightly improved from initial 150, tachypnea respirations 22.  Stable oxygen saturation on room air.  Focal tenderness in the right flank, she is ill-appearing, diaphoretic  Lab tests and imaging are pending for this patient, she will need lab work, urine with culture, she has been presumptively treated with antibiotics, 30 cc/kg fluid bolus, receiving repeat CT stone scan given her recent urologic procedure, she will likely need admission and consult to urology for sepsis secondary to ongoing infection in context of known urinary tract infection and kidney stone.  11:08 PM Care of NEYSA ARTS transferred to Dr. Blinda Leatherwood at the end of my shift as the patient will require reassessment once labs/imaging have resulted. Patient presentation, ED course, and plan of care discussed with review of all pertinent labs and imaging. Please see his/her note for further details regarding further ED course and disposition. Plan at time of handoff is as above. This may be altered or completely changed at the discretion of the  oncoming team pending results of further workup.  Final Clinical Impression(s) / ED Diagnoses Final diagnoses:  None    Rx / DC Orders ED Discharge Orders     None         West Bali 09/25/23 2308    Gloris Manchester, MD 09/25/23 2333

## 2023-09-25 NOTE — ED Triage Notes (Signed)
 Pt c/o high heart rate and fever since procedure on Friday. Pt removed stent at home today.

## 2023-09-25 NOTE — Sepsis Progress Note (Signed)
 Following for sepsis monitoring ?

## 2023-09-25 NOTE — ED Notes (Signed)
 Patient transported to CT

## 2023-09-26 ENCOUNTER — Encounter: Payer: Self-pay | Admitting: Pharmacist

## 2023-09-26 ENCOUNTER — Other Ambulatory Visit (HOSPITAL_COMMUNITY): Payer: Self-pay

## 2023-09-26 ENCOUNTER — Other Ambulatory Visit: Payer: Self-pay

## 2023-09-26 DIAGNOSIS — N390 Urinary tract infection, site not specified: Secondary | ICD-10-CM

## 2023-09-26 DIAGNOSIS — E8809 Other disorders of plasma-protein metabolism, not elsewhere classified: Secondary | ICD-10-CM | POA: Diagnosis not present

## 2023-09-26 DIAGNOSIS — E66812 Obesity, class 2: Secondary | ICD-10-CM | POA: Diagnosis not present

## 2023-09-26 DIAGNOSIS — A419 Sepsis, unspecified organism: Secondary | ICD-10-CM

## 2023-09-26 DIAGNOSIS — E46 Unspecified protein-calorie malnutrition: Secondary | ICD-10-CM | POA: Diagnosis not present

## 2023-09-26 DIAGNOSIS — K5900 Constipation, unspecified: Secondary | ICD-10-CM

## 2023-09-26 DIAGNOSIS — K2950 Unspecified chronic gastritis without bleeding: Secondary | ICD-10-CM | POA: Diagnosis not present

## 2023-09-26 DIAGNOSIS — E782 Mixed hyperlipidemia: Secondary | ICD-10-CM

## 2023-09-26 DIAGNOSIS — R1084 Generalized abdominal pain: Secondary | ICD-10-CM | POA: Diagnosis not present

## 2023-09-26 DIAGNOSIS — N10 Acute tubulo-interstitial nephritis: Secondary | ICD-10-CM | POA: Diagnosis present

## 2023-09-26 LAB — COMPREHENSIVE METABOLIC PANEL
ALT: 13 U/L (ref 0–44)
AST: 11 U/L — ABNORMAL LOW (ref 15–41)
Albumin: 2.6 g/dL — ABNORMAL LOW (ref 3.5–5.0)
Alkaline Phosphatase: 59 U/L (ref 38–126)
Anion gap: 7 (ref 5–15)
BUN: 7 mg/dL (ref 6–20)
CO2: 28 mmol/L (ref 22–32)
Calcium: 8.5 mg/dL — ABNORMAL LOW (ref 8.9–10.3)
Chloride: 104 mmol/L (ref 98–111)
Creatinine, Ser: 0.81 mg/dL (ref 0.44–1.00)
GFR, Estimated: >60 mL/min (ref >60)
Glucose, Bld: 130 mg/dL — ABNORMAL HIGH (ref 70–99)
Potassium: 3.2 mmol/L — ABNORMAL LOW (ref 3.5–5.1)
Sodium: 139 mmol/L (ref 135–145)
Total Bilirubin: 0.6 mg/dL (ref 0.0–1.2)
Total Protein: 6.2 g/dL — ABNORMAL LOW (ref 6.5–8.1)

## 2023-09-26 LAB — URINALYSIS, W/ REFLEX TO CULTURE (INFECTION SUSPECTED)
Bilirubin Urine: NEGATIVE
Glucose, UA: NEGATIVE mg/dL
Ketones, ur: NEGATIVE mg/dL
Nitrite: POSITIVE — AB
Protein, ur: 100 mg/dL — AB
RBC / HPF: >50 RBC/hpf (ref 0–5)
Specific Gravity, Urine: 1.004 — ABNORMAL LOW (ref 1.005–1.030)
WBC, UA: >50 WBC/hpf (ref 0–5)
pH: 6 (ref 5.0–8.0)

## 2023-09-26 LAB — RESP PANEL BY RT-PCR (RSV, FLU A&B, COVID)  RVPGX2
Influenza A by PCR: NEGATIVE
Influenza B by PCR: NEGATIVE
Resp Syncytial Virus by PCR: NEGATIVE
SARS Coronavirus 2 by RT PCR: NEGATIVE

## 2023-09-26 LAB — MAGNESIUM: Magnesium: 1.8 mg/dL (ref 1.7–2.4)

## 2023-09-26 LAB — POC URINE PREG, ED: Preg Test, Ur: NEGATIVE

## 2023-09-26 LAB — PHOSPHORUS: Phosphorus: 2.2 mg/dL — ABNORMAL LOW (ref 2.5–4.6)

## 2023-09-26 MED ORDER — GLUCERNA SHAKE PO LIQD
237.0000 mL | Freq: Three times a day (TID) | ORAL | Status: DC
  Filled 2023-09-26 (×8): qty 237

## 2023-09-26 MED ORDER — ACETAMINOPHEN 325 MG PO TABS
650.0000 mg | ORAL_TABLET | Freq: Four times a day (QID) | ORAL | Status: DC | PRN
  Administered 2023-09-26: 650 mg via ORAL
  Filled 2023-09-26: qty 2

## 2023-09-26 MED ORDER — LINACLOTIDE 72 MCG PO CAPS
72.0000 ug | ORAL_CAPSULE | Freq: Every day | ORAL | Status: DC
  Administered 2023-09-26: 72 ug via ORAL
  Filled 2023-09-26 (×3): qty 1

## 2023-09-26 MED ORDER — ACETAMINOPHEN 650 MG RE SUPP: 650.0000 mg | Freq: Four times a day (QID) | RECTAL | Status: DC | PRN

## 2023-09-26 MED ORDER — SODIUM CHLORIDE 0.9 % IV SOLN
2.0000 g | Freq: Three times a day (TID) | INTRAVENOUS | Status: DC
  Administered 2023-09-26 – 2023-09-27 (×4): 2 g via INTRAVENOUS
  Filled 2023-09-26 (×4): qty 12.5

## 2023-09-26 MED ORDER — ENOXAPARIN SODIUM 40 MG/0.4ML IJ SOSY
40.0000 mg | PREFILLED_SYRINGE | INTRAMUSCULAR | Status: DC
  Administered 2023-09-26: 40 mg via SUBCUTANEOUS
  Filled 2023-09-26 (×2): qty 0.4

## 2023-09-26 MED ORDER — ONDANSETRON HCL 4 MG PO TABS: 4.0000 mg | ORAL_TABLET | Freq: Four times a day (QID) | ORAL | Status: DC | PRN

## 2023-09-26 MED ORDER — ONDANSETRON HCL 4 MG/2ML IJ SOLN: 4.0000 mg | Freq: Four times a day (QID) | INTRAMUSCULAR | Status: DC | PRN

## 2023-09-26 MED ORDER — POTASSIUM CHLORIDE CRYS ER 20 MEQ PO TBCR
40.0000 meq | EXTENDED_RELEASE_TABLET | Freq: Once | ORAL | Status: AC
  Administered 2023-09-26: 40 meq via ORAL
  Filled 2023-09-26: qty 2

## 2023-09-26 MED ORDER — PANTOPRAZOLE SODIUM 40 MG PO TBEC
40.0000 mg | DELAYED_RELEASE_TABLET | Freq: Every day | ORAL | Status: DC
  Administered 2023-09-27: 40 mg via ORAL
  Filled 2023-09-26: qty 1

## 2023-09-26 MED ORDER — HYDRALAZINE HCL 20 MG/ML IJ SOLN: 10.0000 mg | INTRAMUSCULAR | Status: DC | PRN

## 2023-09-26 MED ORDER — ATORVASTATIN CALCIUM 20 MG PO TABS
20.0000 mg | ORAL_TABLET | Freq: Every evening | ORAL | Status: DC
  Administered 2023-09-26: 20 mg via ORAL
  Filled 2023-09-26: qty 1

## 2023-09-26 NOTE — H&P (Signed)
 History and Physical    Patient: Darlene Vazquez ZOX:[id number] DOB: 04/07/1986 DOA: 09/25/2023 DOS: the patient was seen and examined on 09/26/2023 PCP: Royann Shivers, PA-C  Patient coming from: Home  Chief Complaint:  Chief Complaint  Patient presents with   Fever   HPI: Darlene Vazquez is a 38 y.o. female with medical history significant of hyperlipidemia, chronic constipation, GERD who presents to the emergency department due to fever. Patient was recently admitted from 3/8 to 3/10 due to sepsis secondary to ureterolithiasis with stenting on March 8.  On 3/21, definitive treatment with laser lithotripsy and basket retrieval was done. She complained of 2-day onset of fever, palpitations and right flank pain which persists after removing the stent at home as directed by urologist yesterday.  Patient denies chest pain, shortness of breath, nausea or vomiting.  ED Course:  In the emergency department, patient was tachypneic, tachycardic, BP was 150/101, temperature 98.2, O2 sat 98%.  Workup in the ED showed anemia normocytic anemia, BMP was normal except for hypokalemia and hyperglycemia.  Albumin 3.3 urinalysis was positive for UTI.  Influenza A, B, SARS coronavirus SARS 2, RSV was negative.  Blood culture pending. CT abdomen and pelvis without contrast showed moderate right-sided hydroureteronephrosis with right ureteral wall thickening and surrounding fat stranding.  No obstructing calculus at the left.  Findings may related to recently passed calculus or infection. Chest x-ray showed no active disease Dr. Jennette Bill, on-call for urology was consulted and does not feel the patient would require any repeat urology interventions. Patient was treated with IV cefepime, IV hydration per sepsis protocol was provided, Tylenol was given.  Review of Systems: Review of systems as noted in the HPI. All other systems reviewed and are negative.   Past Medical History:  Diagnosis Date   Family  history of adverse reaction to anesthesia    Mom N&V   GERD (gastroesophageal reflux disease)    History of kidney stones    PONV (postoperative nausea and vomiting)    Hard to arouse   Past Surgical History:  Procedure Laterality Date   CYSTOSCOPY W/ RETROGRADES Right 09/22/2023   Procedure: CYSTOSCOPY, WITH RETROGRADE PYELOGRAM;  Surgeon: Jannifer Hick, MD;  Location: [facility];  Service: Urology;  Laterality: Right;   CYSTOSCOPY W/ URETERAL STENT PLACEMENT Right 09/10/2023   Procedure: CYSTOSCOPY, WITH RETROGRADE PYELOGRAM AND URETERAL STENT INSERTION;  Surgeon: Jannifer Hick, MD;  Location: [facility];  Service: Urology;  Laterality: Right;   CYSTOSCOPY/URETEROSCOPY/HOLMIUM LASER/STENT PLACEMENT Right 09/22/2023   Procedure: CYSTOSCOPY/URETEROSCOPY/HOLMIUM LASER/STENT PLACEMENT;  Surgeon: Jannifer Hick, MD;  Location: [facility];  Service: Urology;  Laterality: Right;   DIAGNOSTIC LAPAROSCOPY WITH REMOVAL OF ECTOPIC PREGNANCY Right 11/09/2013   Procedure: DIAGNOSTIC LAPAROSCOPY WITH REMOVAL OF ECTOPIC PREGNANCY;  Surgeon: Lazaro Arms, MD;  Location: [facility];  Service: Gynecology;  Laterality: Right;   ESOPHAGOGASTRODUODENOSCOPY N/A 08/12/2014   SLF: 1. No obvious source for epigastric pain identified. 2. Mild gastiritis.    LAPAROSCOPY N/A 11/09/2013   Procedure: LAPAROSCOPY OPERATIVE;  Surgeon: Lazaro Arms, MD;  Location: [facility];  Service: Gynecology;  Laterality: N/A;   UNILATERAL SALPINGECTOMY Right 11/09/2013   Procedure: UNILATERAL SALPINGECTOMY;  Surgeon: Lazaro Arms, MD;  Location: [facility];  Service: Gynecology;  Laterality: Right;    Social History:  reports that she has never smoked. She has never used smokeless tobacco. She reports that she does not drink alcohol and does not use drugs.   Allergies  Allergen  Reactions   Other Hives and Other (See Comments)    Hair dye   Sulfa Antibiotics Hives and Itching    Family History  Problem Relation Age of Onset   Wilson's disease Maternal  Grandfather    Colon cancer Neg Hx    Colon polyps Neg Hx      Prior to Admission medications   Medication Sig Start Date End Date Taking? Authorizing Provider  atorvastatin (LIPITOR) 20 MG tablet Take 1 tablet (20 mg total) by mouth every evening. 08/25/23     cephALEXin (KEFLEX) 500 MG capsule Take 1 capsule (500 mg total) by mouth 2 (two) times daily. 09/25/23     docusate sodium (COLACE) 100 MG capsule Take 1 capsule (100 mg total) by mouth daily as needed for up to 30 doses. 09/22/23   Jannifer Hick, MD  linaclotide [facility]) 72 MCG capsule Take 1 capsule (72 mcg total) by mouth daily. 07/12/23     oxyCODONE-acetaminophen (PERCOCET) 5-325 MG tablet Take 1 tablet by mouth every 4 (four) hours as needed for up to 18 doses for severe pain (pain score 7-10). 09/22/23   Jannifer Hick, MD  pantoprazole (PROTONIX) 40 MG tablet Take 1 tablet (40 mg total) by mouth daily. Patient taking differently: Take 40 mg by mouth daily before breakfast. 07/12/23     Prucalopride Succinate (MOTEGRITY) 2 MG TABS Take 2 mg by mouth daily. Patient not taking: Reported on 09/10/2023 09/12/18   West Bali, MD  TYLENOL 500 MG tablet Take 500-1,000 mg by mouth every 8 (eight) hours as needed for mild pain (pain score 1-3) or headache.    [provider]    Physical Exam: BP (!) 142/87   Pulse 93   Temp 99.2 F (37.3 C) (Oral)   Resp 17   Ht 5\' 2"  (1.575 m)   Wt 90.7 kg   LMP 08/30/2023 (Approximate)   SpO2 97%   BMI 36.57 kg/m   General: 38 y.o. year-old female well developed well nourished in no acute distress.  Alert and oriented x3. HEENT: NCAT, EOMI Neck: Supple, trachea medial Cardiovascular: Regular rate and rhythm with no rubs or gallops.  No thyromegaly or JVD noted.  No lower extremity edema. 2/4 pulses in all 4 extremities. Respiratory: Clear to auscultation with no wheezes or rales. Good inspiratory effort. Abdomen: Soft, right flank pain tenderness. Normal bowel sounds x4  quadrants. Muskuloskeletal: No cyanosis, clubbing or edema noted bilaterally Neuro: CN II-XII intact, strength 5/5 x 4, sensation, reflexes intact Skin: No ulcerative lesions noted or rashes Psychiatry: Mood is appropriate for condition and setting          Labs on Admission:  Basic Metabolic Panel: Recent Labs  Lab 09/25/23 2257  NA 135  K 2.7*  CL 98  CO2 23  GLUCOSE 149*  BUN 8  CREATININE 0.95  CALCIUM 8.7*   Liver Function Tests: Recent Labs  Lab 09/25/23 2257  AST 14*  ALT 15  ALKPHOS 78  BILITOT 0.7  PROT 7.3  ALBUMIN 3.3*   No results for input(s): "LIPASE", "AMYLASE" in the last 168 hours. No results for input(s): "AMMONIA" in the last 168 hours. CBC: Recent Labs  Lab 09/25/23 2257  WBC 8.9  NEUTROABS 6.1  HGB 10.8*  HCT 33.9*  MCV 83.3  PLT 330   Cardiac Enzymes: No results for input(s): "CKTOTAL", "CKMB", "CKMBINDEX", "TROPONINI" in the last 168 hours.  BNP (last 3 results) No results for input(s): "BNP" in the last  8760 hours.  ProBNP (last 3 results) No results for input(s): "PROBNP" in the last 8760 hours.  CBG: No results for input(s): "GLUCAP" in the last 168 hours.  Radiological Exams on Admission: CT Renal Stone Study Result Date: 09/26/2023 CLINICAL DATA:  Abdominal and flank pain. EXAM: CT ABDOMEN AND PELVIS WITHOUT CONTRAST TECHNIQUE: Multidetector CT imaging of the abdomen and pelvis was performed following the standard protocol without IV contrast. RADIATION DOSE REDUCTION: This exam was performed according to the departmental dose-optimization program which includes automated exposure control, adjustment of the mA and/or kV according to patient size and/or use of iterative reconstruction technique. COMPARISON:  CT abdomen and pelvis 09/09/2023 FINDINGS: Lower chest: There is atelectasis in the right lung base. Hepatobiliary: No focal liver abnormality is seen. No gallstones, gallbladder wall thickening, or biliary dilatation.  Pancreas: Unremarkable. No pancreatic ductal dilatation or surrounding inflammatory changes. Spleen: Normal in size without focal abnormality. Adrenals/Urinary Tract: There is moderate right-sided hydroureteronephrosis. There is right ureteral wall thickening. There is fat stranding surrounding the right kidney and proximal right ureter. No obstructing calculus identified. There is a small amount of air in the bladder. The bladder is otherwise within normal limits. The left kidney and adrenal glands are within normal limits. Stomach/Bowel: Stomach is within normal limits. Appendix appears normal. No evidence of bowel wall thickening, distention, or inflammatory changes. There is diffuse colonic diverticulosis. Vascular/Lymphatic: No significant vascular findings are present. No enlarged abdominal or pelvic lymph nodes. Reproductive: Multiple uterine fibroids are present measuring up to 4.4 cm, unchanged. Ovaries are within normal limits. Other: There is a moderate-sized fat containing umbilical hernia. There is no ascites. Musculoskeletal: No acute or significant osseous findings. IMPRESSION: 1. Moderate right-sided hydroureteronephrosis with right ureteral wall thickening and surrounding fat stranding. No obstructing calculus identified. Findings may be related to recently passed calculus or infection. 2. Small amount of air in the bladder, possibly related to recent instrumentation or infection. 3. Colonic diverticulosis. 4. Uterine fibroids. 5. Moderate-sized fat containing umbilical hernia. Electronically Signed   By: Darliss Cheney M.D.   On: 09/26/2023 00:06   DG Chest Port 1 View Result Date: 09/25/2023 CLINICAL DATA:  Question of sepsis to evaluate for abnormality. EXAM: PORTABLE CHEST 1 VIEW COMPARISON:  01/05/2019 FINDINGS: The heart size and mediastinal contours are within normal limits. Both lungs are clear. The visualized skeletal structures are unremarkable. IMPRESSION: No active disease.  Electronically Signed   By: Burman Nieves M.D.   On: 09/25/2023 23:04    EKG: I independently viewed the EKG done and my findings are as followed: Sinus tachycardia at rate of 105 bpm  Assessment/Plan Present on Admission:  Acute pyelonephritis  Hypokalemia  Mixed hyperlipidemia  Constipation  Obesity, Class II, BMI 35-39.9  Principal Problem:   Acute pyelonephritis Active Problems:   Constipation   Hypokalemia   Mixed hyperlipidemia   Obesity, Class II, BMI 35-39.9   Hypoalbuminemia due to protein-calorie malnutrition (HCC)   Chronic gastritis  Sepsis secondary to acute pyelonephritis Patient met sepsis criteria due to being tachypneic and tachycardic with source of infection being pyelonephritis. Patient grew E. coli from body fluid culture/cystoscopy done on 09/10/2023 and this was sensitive to cefepime.  Patient was started on IV cefepime, we shall continue same at this time Continue Tylenol as needed  Hypokalemia K+ 2.7, this will be replenished  Hypoalbuminemia secondary to mild protein calorie malnutrition Albumin 3.3, protein supplement will be provided  Mixed hyperlipidemia Continue home Lipitor    Chronic gastritis Continue  Protonix   Constipation Continue home Linzess   Class II obesity (BMI 36.57) Diet and lifestyle medication   DVT prophylaxis: Lovenox  Code Status: Full code  Family Communication: None at bedside  Consults: None  Severity of Illness: The appropriate patient status for this patient is INPATIENT. Inpatient status is judged to be reasonable and necessary in order to provide the required intensity of service to ensure the patient's safety. The patient's presenting symptoms, physical exam findings, and initial radiographic and laboratory data in the context of their chronic comorbidities is felt to place them at high risk for further clinical deterioration. Furthermore, it is not anticipated that the patient will be medically stable  for discharge from the hospital within 2 midnights of admission.   * I certify that at the point of admission it is my clinical judgment that the patient will require inpatient hospital care spanning beyond 2 midnights from the point of admission due to high intensity of service, high risk for further deterioration and high frequency of surveillance required.*  Author: Frankey Shown, DO 09/26/2023 5:20 AM  For on call review [email or web address].

## 2023-09-26 NOTE — ED Provider Notes (Signed)
 Patient signed out to me at shift change.  Patient treated for sepsis secondary to ureterolithiasis with stenting on March 8.  She went back to the OR on March 21 and had definitive treatment with laser lithotripsy basket retrieval.  Patient reports that she started having fevers yesterday.  She removed her stent today as she was instructed after her surgery but tonight has been experiencing right-sided pain and fever.  Urinalysis suggestive of infection.  She grew E. coli with her urosepsis.  She had been administered cefepime prior to signout.  This should cover.  CT scan shows moderate hydro without obstructing stone.  Finding discussed with Dr. Jennette Bill, on-call for urology.  He does not feel the patient would require any repeat urology interventions.  Should, however, be treated for possible Pilo.  Can admit to [facility], IV antibiotics.   Gilda Crease, MD 09/26/23 0100

## 2023-09-26 NOTE — Progress Notes (Signed)
 Transition of Care Department [facility]) has reviewed patient and no TOC needs have been identified at this time. We will continue to monitor patient advancement through interdisciplinary progression rounds. If new patient transition needs arise, please place a TOC consult.   09/26/23 1135  TOC Brief Assessment  Insurance and Status Reviewed  Patient has primary care physician Yes  Home environment has been reviewed Home with family  Prior level of function: Independent  Prior/Current Home Services No current home services  Social Drivers of Health Review SDOH reviewed no interventions necessary  Readmission risk has been reviewed Yes  Transition of care needs no transition of care needs at this time

## 2023-09-26 NOTE — Consult Note (Signed)
 Chief Complaint:  Chief Complaint  Patient presents with   Fever  Consulting physician: Dr. Sherryll Burger  History of Present Illness:  Darlene Vazquez is a 38 y.o. female who is seen in consultation for fever and right flank pain following stent removal.  She underwent right-sided ureteroscopy with holmium laser and extraction of the right ureteral stone on 3/21 by Dr. Cardell Peach.  Procedure was without complication.  This was approximately 2 weeks after she was urgently stented for an obstructing right ureteral stone with sepsis.  She was told to remove her stent yesterday.  She did this without complication originally.  However, she did develop pain after this.  She states that she did have a slight fever prior to stent removal.  She presented to the emergency room.  [facility] late last night.  She was found to have infected looking urine, CT scan revealed right hydronephrosis with hydroureter down to the area of the previous stone.  No remaining stone fragments noted on the right or left.  She has been admitted to the hospital.  Since then, she has had decreasing fever, minimal flank pain, and is currently stable.   Past Medical History:  Past Medical History:  Diagnosis Date   Family history of adverse reaction to anesthesia    Mom N&V   GERD (gastroesophageal reflux disease)    History of kidney stones    PONV (postoperative nausea and vomiting)    Hard to arouse    Past Surgical History:  Past Surgical History:  Procedure Laterality Date   CYSTOSCOPY W/ RETROGRADES Right 09/22/2023   Procedure: CYSTOSCOPY, WITH RETROGRADE PYELOGRAM;  Surgeon: Jannifer Hick, MD;  Location: [facility];  Service: Urology;  Laterality: Right;   CYSTOSCOPY W/ URETERAL STENT PLACEMENT Right 09/10/2023   Procedure: CYSTOSCOPY, WITH RETROGRADE PYELOGRAM AND URETERAL STENT INSERTION;  Surgeon: Jannifer Hick, MD;  Location: [facility];  Service: Urology;  Laterality: Right;   CYSTOSCOPY/URETEROSCOPY/HOLMIUM  LASER/STENT PLACEMENT Right 09/22/2023   Procedure: CYSTOSCOPY/URETEROSCOPY/HOLMIUM LASER/STENT PLACEMENT;  Surgeon: Jannifer Hick, MD;  Location: [facility];  Service: Urology;  Laterality: Right;   DIAGNOSTIC LAPAROSCOPY WITH REMOVAL OF ECTOPIC PREGNANCY Right 11/09/2013   Procedure: DIAGNOSTIC LAPAROSCOPY WITH REMOVAL OF ECTOPIC PREGNANCY;  Surgeon: Lazaro Arms, MD;  Location: [facility];  Service: Gynecology;  Laterality: Right;   ESOPHAGOGASTRODUODENOSCOPY N/A 08/12/2014   SLF: 1. No obvious source for epigastric pain identified. 2. Mild gastiritis.    LAPAROSCOPY N/A 11/09/2013   Procedure: LAPAROSCOPY OPERATIVE;  Surgeon: Lazaro Arms, MD;  Location: [facility];  Service: Gynecology;  Laterality: N/A;   UNILATERAL SALPINGECTOMY Right 11/09/2013   Procedure: UNILATERAL SALPINGECTOMY;  Surgeon: Lazaro Arms, MD;  Location: [facility];  Service: Gynecology;  Laterality: Right;    Allergies:  Allergies  Allergen Reactions   Other Hives and Other (See Comments)    Hair dye   Sulfa Antibiotics Hives and Itching    Family History:  Family History  Problem Relation Age of Onset   Wilson's disease Maternal Grandfather    Colon cancer Neg Hx    Colon polyps Neg Hx     Social History:  Social History   Tobacco Use   Smoking status: Never   Smokeless tobacco: Never   Tobacco comments:    Never smoked  Vaping Use   Vaping status: Never Used  Substance Use Topics   Alcohol use: No    Alcohol/week: 0.0 standard drinks of alcohol   Drug use: No  Review of symptoms:  Constitutional:  Negative for unexplained weight loss, night sweats, fever, chills ENT:  Negative for nose bleeds, sinus pain, painful swallowing CV:  Negative for chest pain, shortness of breath, exercise intolerance, palpitations, loss of consciousness Resp:  Negative for cough, wheezing, shortness of breath GI:  Negative for nausea, vomiting, diarrhea, bloody stools GU:  Positives noted in HPI; otherwise negative for gross  hematuria, dysuria, urinary incontinence Neuro:  Negative for seizures, poor balance, limb weakness, slurred speech Psych:  Negative for lack of energy, depression, anxiety Endocrine:  Negative for polydipsia, polyuria, symptoms of hypoglycemia (dizziness, hunger, sweating) Hematologic:  Negative for anemia, purpura, petechia, prolonged or excessive bleeding, use of anticoagulants  Allergic:  Negative for difficulty breathing or choking as a result of exposure to anything; no shellfish allergy; no allergic response (rash/itch) to materials, foods  Physical exam: BP (!) 149/105 (BP Location: Right Arm)   Pulse 98   Temp 99.2 F (37.3 C) (Oral)   Resp 18   Ht 5\' 2"  (1.575 m)   Wt 90.7 kg   LMP 08/30/2023 (Approximate)   SpO2 100%   BMI 36.57 kg/m  GENERAL APPEARANCE:  Well appearing, well developed, well nourished, NAD HEENT: Atraumatic, Normocephalic LUNGS: Normal inspiratory and expiratory excursion HEART: Regular Rate and Rhythm without murmurs, gallops, or rubs. ABDOMEN: Soft, no anterior tenderness EXTREMITIES: Moves all extremities well.  Without clubbing, cyanosis, or edema. NEUROLOGIC:  Alert and oriented x 3, normal gait, CN II-XII grossly intact.  MENTAL STATUS:  Appropriate. BACK: .  Mild right knee tenderness SKIN:  Warm, dry and intact.    Results: Results for orders placed or performed during the hospital encounter of 09/25/23 (from the past 24 hours)  Blood Culture (routine x 2)   Collection Time: 09/25/23 10:57 PM   Specimen: BLOOD  Result Value Ref Range   Specimen Description BLOOD BLOOD RIGHT HAND    Special Requests      BOTTLES DRAWN AEROBIC AND ANAEROBIC Blood Culture adequate volume   Culture      NO GROWTH < 12 HOURS Performed at [facility], [address], [place], [place] [address]    Report Status PENDING   Blood Culture (routine x 2)   Collection Time: 09/25/23 10:57 PM   Specimen: BLOOD  Result Value Ref Range   Specimen Description BLOOD  BLOOD LEFT ARM    Special Requests      BOTTLES DRAWN AEROBIC AND ANAEROBIC Blood Culture results may not be optimal due to an inadequate volume of blood received in culture bottles   Culture      NO GROWTH < 12 HOURS Performed at [facility], [address] [place], [place] [address]    Report Status PENDING   Lactic acid, plasma   Collection Time: 09/25/23 10:57 PM  Result Value Ref Range   Lactic Acid, Venous 1.1 0.5 - 1.9 mmol/L  Comprehensive metabolic panel   Collection Time: 09/25/23 10:57 PM  Result Value Ref Range   Sodium 135 135 - 145 mmol/L   Potassium 2.7 (LL) 3.5 - 5.1 mmol/L   Chloride 98 98 - 111 mmol/L   CO2 23 22 - 32 mmol/L   Glucose, Bld 149 (H) 70 - 99 mg/dL   BUN 8 6 - 20 mg/dL   Creatinine, Ser 8.46 0.44 - 1.00 mg/dL   Calcium 8.7 (L) 8.9 - 10.3 mg/dL   Total Protein 7.3 6.5 - 8.1 g/dL   Albumin 3.3 (L) 3.5 - 5.0 g/dL  AST 14 (L) 15 - 41 U/L   ALT 15 0 - 44 U/L   Alkaline Phosphatase 78 38 - 126 U/L   Total Bilirubin 0.7 0.0 - 1.2 mg/dL   GFR, Estimated >16 >10 mL/min   Anion gap 14 5 - 15  CBC with Differential   Collection Time: 09/25/23 10:57 PM  Result Value Ref Range   WBC 8.9 4.0 - 10.5 K/uL   RBC 4.07 3.87 - 5.11 MIL/uL   Hemoglobin 10.8 (L) 12.0 - 15.0 g/dL   HCT 96.0 (L) 45.4 - 09.8 %   MCV 83.3 80.0 - 100.0 fL   MCH 26.5 26.0 - 34.0 pg   MCHC 31.9 30.0 - 36.0 g/dL   RDW 11.9 14.7 - 82.9 %   Platelets 330 150 - 400 K/uL   nRBC 0.0 0.0 - 0.2 %   Neutrophils Relative % 69 %   Neutro Abs 6.1 1.7 - 7.7 K/uL   Lymphocytes Relative 18 %   Lymphs Abs 1.6 0.7 - 4.0 K/uL   Monocytes Relative 12 %   Monocytes Absolute 1.0 0.1 - 1.0 K/uL   Eosinophils Relative 1 %   Eosinophils Absolute 0.1 0.0 - 0.5 K/uL   Basophils Relative 0 %   Basophils Absolute 0.0 0.0 - 0.1 K/uL   Immature Granulocytes 0 %   Abs Immature Granulocytes 0.02 0.00 - 0.07 K/uL  Protime-INR   Collection Time: 09/25/23 10:57 PM  Result Value Ref Range   Prothrombin  Time 14.0 11.4 - 15.2 seconds   INR 1.1 0.8 - 1.2  APTT   Collection Time: 09/25/23 10:57 PM  Result Value Ref Range   aPTT 34 24 - 36 seconds  Magnesium   Collection Time: 09/25/23 10:57 PM  Result Value Ref Range   Magnesium 1.8 1.7 - 2.4 mg/dL  Phosphorus   Collection Time: 09/25/23 10:57 PM  Result Value Ref Range   Phosphorus 2.2 (L) 2.5 - 4.6 mg/dL  Resp panel by RT-PCR (RSV, Flu A&B, Covid) Anterior Nasal Swab   Collection Time: 09/25/23 11:18 PM   Specimen: Anterior Nasal Swab  Result Value Ref Range   SARS Coronavirus 2 by RT PCR NEGATIVE NEGATIVE   Influenza A by PCR NEGATIVE NEGATIVE   Influenza B by PCR NEGATIVE NEGATIVE   Resp Syncytial Virus by PCR NEGATIVE NEGATIVE  Urinalysis, w/ Reflex to Culture (Infection Suspected) -Urine, Clean Catch   Collection Time: 09/26/23 12:20 AM  Result Value Ref Range   Specimen Source URINE, CLEAN CATCH    Color, Urine YELLOW YELLOW   APPearance HAZY (A) CLEAR   Specific Gravity, Urine 1.004 (L) 1.005 - 1.030   pH 6.0 5.0 - 8.0   Glucose, UA NEGATIVE NEGATIVE mg/dL   Hgb urine dipstick LARGE (A) NEGATIVE   Bilirubin Urine NEGATIVE NEGATIVE   Ketones, ur NEGATIVE NEGATIVE mg/dL   Protein, ur 562 (A) NEGATIVE mg/dL   Nitrite POSITIVE (A) NEGATIVE   Leukocytes,Ua LARGE (A) NEGATIVE   RBC / HPF >50 0 - 5 RBC/hpf   WBC, UA >50 0 - 5 WBC/hpf   Bacteria, UA RARE (A) NONE SEEN   Squamous Epithelial / HPF 0-5 0 - 5 /HPF   WBC Clumps PRESENT   POC urine preg, ED   Collection Time: 09/26/23 12:24 AM  Result Value Ref Range   Preg Test, Ur NEGATIVE NEGATIVE  Comprehensive metabolic panel   Collection Time: 09/26/23  6:08 AM  Result Value Ref Range   Sodium 139 135 -  145 mmol/L   Potassium 3.2 (L) 3.5 - 5.1 mmol/L   Chloride 104 98 - 111 mmol/L   CO2 28 22 - 32 mmol/L   Glucose, Bld 130 (H) 70 - 99 mg/dL   BUN 7 6 - 20 mg/dL   Creatinine, Ser 0.45 0.44 - 1.00 mg/dL   Calcium 8.5 (L) 8.9 - 10.3 mg/dL   Total Protein 6.2 (L)  6.5 - 8.1 g/dL   Albumin 2.6 (L) 3.5 - 5.0 g/dL   AST 11 (L) 15 - 41 U/L   ALT 13 0 - 44 U/L   Alkaline Phosphatase 59 38 - 126 U/L   Total Bilirubin 0.6 0.0 - 1.2 mg/dL   GFR, Estimated >40 >98 mL/min   Anion gap 7 5 - 15    I have reviewed prior patient's records  I have reviewed referring/prior physicians records--ER/hospitalist note  I have reviewed urinalysis  I have reviewed prior urine cultures  I reviewed prior imaging studies--CT images reviewed  Assessment: 1.  Right ureteral stone, 4 days out from ureteroscopic management, no stone fragments remaining.  2.  Recent sepsis, treated with initial stenting and antibiotic management.  3.  Postoperative fever, probable UTI, currently doing better on antibiotics  4.  Mild right hydronephrosis, not unexpected postoperatively after stent removal.  Plan: 1.  I agree with antibiotic management.  Continue this for a full 7 days when she goes home  2.  From a urologic standpoint, I think she is fine to go home tomorrow  3.  She has an appointment already with Dr. Cardell Peach in [place] that appointment

## 2023-09-26 NOTE — Plan of Care (Signed)

## 2023-09-26 NOTE — Progress Notes (Signed)
 Pharmacy Antibiotic Note  Darlene Vazquez is a 38 y.o. female admitted on 09/25/2023 with UTI/Pyelonephritis.  Pharmacy has been consulted for Cefepime dosing. WBC WNL. Renal function good. Previous culture with E coli.   Plan: Cefepime 2g IV q8h F/U repeat cultures   Height: 5\' 2"  (157.5 cm) Weight: 90.7 kg (199 lb 15.3 oz) IBW/kg (Calculated) : 50.1  Temp (24hrs), Avg:99.2 F (37.3 C), Min:99.2 F (37.3 C), Max:99.2 F (37.3 C)  Recent Labs  Lab 09/25/23 2257  WBC 8.9  CREATININE 0.95  LATICACIDVEN 1.1    Estimated Creatinine Clearance: 82.4 mL/min (by C-G formula based on SCr of 0.95 mg/dL).    Allergies  Allergen Reactions   Other Hives and Other (See Comments)    Hair dye   Sulfa Antibiotics Hives and Itching   Abran Duke, PharmD, BCPS Clinical Pharmacist Phone: [phone]

## 2023-09-26 NOTE — Progress Notes (Signed)
 Darlene Vazquez is a 38 y.o. female with medical history significant of hyperlipidemia, chronic constipation, GERD who presents to the emergency department due to fever. Patient was recently admitted from 3/8 to 3/10 due to sepsis secondary to ureterolithiasis with stenting on March 8.  On 3/21, definitive treatment with laser lithotripsy and basket retrieval was done. She complained of 2-day onset of fever, palpitations and right flank pain which persists after removing the stent at home as directed by urologist yesterday.   She has been admitted with sepsis, POA secondary to acute right-sided pyelonephritis and has been started on cefepime.  She overall appears to be doing well this morning and denies any significant complaints or concerns.  She has noted to have mild hypokalemia which will be repleted and will need to be followed up in a.m.  Creatinine levels are stable.  Anticipate discharge in a.m. on oral antibiotics if labs remain stable and she is no longer asymptomatic .  Patient seen and evaluated at bedside with husband present.  She has been admitted after midnight.  Total care time: 30 minutes.

## 2023-09-27 DIAGNOSIS — N390 Urinary tract infection, site not specified: Secondary | ICD-10-CM | POA: Diagnosis present

## 2023-09-27 DIAGNOSIS — E876 Hypokalemia: Secondary | ICD-10-CM | POA: Diagnosis not present

## 2023-09-27 DIAGNOSIS — N10 Acute tubulo-interstitial nephritis: Secondary | ICD-10-CM | POA: Diagnosis not present

## 2023-09-27 LAB — CBC
HCT: 33.1 % — ABNORMAL LOW (ref 36.0–46.0)
Hemoglobin: 10.4 g/dL — ABNORMAL LOW (ref 12.0–15.0)
MCH: 26.9 pg (ref 26.0–34.0)
MCHC: 31.4 g/dL (ref 30.0–36.0)
MCV: 85.8 fL (ref 80.0–100.0)
Platelets: 389 10*3/uL (ref 150–400)
RBC: 3.86 MIL/uL — ABNORMAL LOW (ref 3.87–5.11)
RDW: 15.5 % (ref 11.5–15.5)
WBC: 5.4 10*3/uL (ref 4.0–10.5)
nRBC: 0 % (ref 0.0–0.2)

## 2023-09-27 LAB — URINE CULTURE: Culture: <10000 — AB

## 2023-09-27 LAB — BASIC METABOLIC PANEL
Anion gap: 8 (ref 5–15)
BUN: 11 mg/dL (ref 6–20)
CO2: 28 mmol/L (ref 22–32)
Calcium: 9 mg/dL (ref 8.9–10.3)
Chloride: 106 mmol/L (ref 98–111)
Creatinine, Ser: 0.82 mg/dL (ref 0.44–1.00)
GFR, Estimated: >60 mL/min (ref >60)
Glucose, Bld: 121 mg/dL — ABNORMAL HIGH (ref 70–99)
Potassium: 3.6 mmol/L (ref 3.5–5.1)
Sodium: 142 mmol/L (ref 135–145)

## 2023-09-27 LAB — MAGNESIUM: Magnesium: 2.3 mg/dL (ref 1.7–2.4)

## 2023-09-27 MED ORDER — CEPHALEXIN 500 MG PO CAPS: 500.0000 mg | ORAL_CAPSULE | Freq: Three times a day (TID) | ORAL | 0 refills | Status: AC

## 2023-09-27 NOTE — Discharge Instructions (Signed)
IMPORTANT INFORMATION: PAY CLOSE ATTENTION  ° °PHYSICIAN DISCHARGE INSTRUCTIONS ° °Follow with Primary care provider  Skillman, Katherine E, PA-C  and other consultants as instructed by your Hospitalist Physician ° °SEEK MEDICAL CARE OR RETURN TO EMERGENCY ROOM IF SYMPTOMS COME BACK, WORSEN OR NEW PROBLEM DEVELOPS  ° °Please note: °You were cared for by a hospitalist during your hospital stay. Every effort will be made to forward records to your primary care provider.  You can request that your primary care provider send for your hospital records if they have not received them.  Once you are discharged, your primary care physician will handle any further medical issues. Please note that NO REFILLS for any discharge medications will be authorized once you are discharged, as it is imperative that you return to your primary care physician (or establish a relationship with a primary care physician if you do not have one) for your post hospital discharge needs so that they can reassess your need for medications and monitor your lab values. ° °Please get a complete blood count and chemistry panel checked by your Primary MD at your next visit, and again as instructed by your Primary MD. ° °Get Medicines reviewed and adjusted: °Please take all your medications with you for your next visit with your Primary MD ° °Laboratory/radiological data: °Please request your Primary MD to go over all hospital tests and procedure/radiological results at the follow up, please ask your primary care provider to get all Hospital records sent to his/her office. ° °In some cases, they will be blood work, cultures and biopsy results pending at the time of your discharge. Please request that your primary care provider follow up on these results. ° °If you are diabetic, please bring your blood sugar readings with you to your follow up appointment with primary care.   ° °Please call and make your follow up appointments as soon as possible.    ° °Also Note the following: °If you experience worsening of your admission symptoms, develop shortness of breath, life threatening emergency, suicidal or homicidal thoughts you must seek medical attention immediately by calling 911 or calling your MD immediately  if symptoms less severe. ° °You must read complete instructions/literature along with all the possible adverse reactions/side effects for all the Medicines you take and that have been prescribed to you. Take any new Medicines after you have completely understood and accpet all the possible adverse reactions/side effects.  ° °Do not drive when taking Pain medications or sleeping medications (Benzodiazepines) ° °Do not take more than prescribed Pain, Sleep and Anxiety Medications. It is not advisable to combine anxiety,sleep and pain medications without talking with your primary care practitioner ° °Special Instructions: If you have smoked or chewed Tobacco  in the last 2 yrs please stop smoking, stop any regular Alcohol  and or any Recreational drug use. ° °Wear Seat belts while driving.  Do not drive if taking any narcotic, mind altering or controlled substances or recreational drugs or alcohol.  ° ° ° ° ° °

## 2023-09-27 NOTE — Discharge Summary (Addendum)
 Physician Discharge Summary  Darlene Vazquez LKG:[id number] DOB: 07-Apr-1986 DOA: 09/25/2023  PCP: Royann Shivers, PA-C Urologist: Dr. Burman Foster  Admit date: 09/25/2023 Discharge date: 09/27/2023  Admitted From:  Home  Disposition: Home   Recommendations for Outpatient Follow-up:  Follow up with PCP in 1 weeks Follow up with Dr. Cardell Peach in 2 weeks   Discharge Condition: STABLE   CODE STATUS: FULL DIET: regular    Brief Hospitalization Summary: Please see all hospital notes, images, labs for full details of the hospitalization. Admission provider HPI:  38 y.o. female with medical history significant of hyperlipidemia, chronic constipation, GERD who presents to the emergency department due to fever.  Patient was recently admitted from 3/8 to 3/10 due to sepsis secondary to ureterolithiasis with stenting on March 8.  On 3/21, definitive treatment with laser lithotripsy and basket retrieval was done.  She complained of 2-day onset of fever, palpitations and right flank pain which persists after removing the stent at home as directed by urologist yesterday.  Patient denies chest pain, shortness of breath, nausea or vomiting.   ED Course:  In the emergency department, patient was tachypneic, tachycardic, BP was 150/101, temperature 98.2, O2 sat 98%.  Workup in the ED showed anemia normocytic anemia, BMP was normal except for hypokalemia and hyperglycemia.  Albumin 3.3 urinalysis was positive for UTI.  Influenza A, B, SARS coronavirus SARS 2, RSV was negative.  Blood culture pending.   CT abdomen and pelvis without contrast showed moderate right-sided hydroureteronephrosis with right ureteral wall thickening and surrounding fat stranding.  No obstructing calculus at the left.  Findings may related to recently passed calculus or infection.  Chest x-ray showed no active disease.  Dr. Jennette Bill, on-call for urology was consulted and does not feel the patient would require any repeat urology interventions.   Patient was treated with IV cefepime, IV hydration per sepsis protocol was provided, Tylenol was given.   Hospital Course  Pt was admitted with sepsis physiology secondary to urinary tract infection and was treated with sepsis protocol also treated for hypokalemia and dehydration with IV fluids.  This has been repleted.  She was treated with broad-spectrum antibiotics.  She had repeated cultures done.  No growth to date.  Patient was seen by urologist Dr. Retta Diones who recommended ongoing antibiotic management and reported patient is fine to discharge home today but would need to continue antibiotics for full 7 days at discharge.  Patient has an appointment already with Dr. Cardell Peach in [place] and was advised to keep that appointment.  Patient is feeling much better today.  She is eating and drinking well with no nausea vomiting or diarrhea symptoms.  She reports that she will take her prescriptions as recommended and follow-up as recommended.  She is discharging home in stable condition.  Patient is to take 7 days of cephalexin to complete course of therapy.  Close follow-up with PCP and urologist Dr. Cardell Peach.  Discharge Diagnoses:  Principal Problem:   UTI (urinary tract infection) Active Problems:   Constipation   Hypokalemia   Mixed hyperlipidemia   Obesity, Class II, BMI 35-39.9   Hypoalbuminemia due to protein-calorie malnutrition (HCC)   Chronic gastritis   Discharge Instructions:  Allergies as of 09/27/2023       Reactions   Other Hives, Other (See Comments)   Hair dye   Sulfa Antibiotics Hives, Itching        Medication List     TAKE these medications    atorvastatin 20 MG  tablet Commonly known as: LIPITOR Take 1 tablet (20 mg total) by mouth every evening.   cephALEXin 500 MG capsule Commonly known as: KEFLEX Take 1 capsule (500 mg total) by mouth 3 (three) times daily for 7 days. What changed: when to take this   docusate sodium 100 MG capsule Commonly known as:  Colace Take 1 capsule (100 mg total) by mouth daily as needed for up to 30 doses.   Linzess 72 MCG capsule Generic drug: linaclotide Take 1 capsule (72 mcg total) by mouth daily.   ondansetron 8 MG tablet Commonly known as: ZOFRAN Take 8 mg by mouth every 8 (eight) hours as needed.   oxyCODONE-acetaminophen 5-325 MG tablet Commonly known as: Percocet Take 1 tablet by mouth every 4 (four) hours as needed for up to 18 doses for severe pain (pain score 7-10).   pantoprazole 40 MG tablet Commonly known as: Protonix Take 1 tablet (40 mg total) by mouth daily. What changed: when to take this   TYLENOL 500 MG tablet Generic drug: acetaminophen Take 500-1,000 mg by mouth every 8 (eight) hours as needed for mild pain (pain score 1-3) or headache.        Follow-up Information     Jannifer Hick, MD. Schedule an appointment as soon as possible for a visit in 2 week(s).   Specialty: Urology Why: Hospital Follow Up Contact information: [address] [place] [address] [phone]                Allergies  Allergen Reactions   Other Hives and Other (See Comments)    Hair dye   Sulfa Antibiotics Hives and Itching   Allergies as of 09/27/2023       Reactions   Other Hives, Other (See Comments)   Hair dye   Sulfa Antibiotics Hives, Itching        Medication List     TAKE these medications    atorvastatin 20 MG tablet Commonly known as: LIPITOR Take 1 tablet (20 mg total) by mouth every evening.   cephALEXin 500 MG capsule Commonly known as: KEFLEX Take 1 capsule (500 mg total) by mouth 3 (three) times daily for 7 days. What changed: when to take this   docusate sodium 100 MG capsule Commonly known as: Colace Take 1 capsule (100 mg total) by mouth daily as needed for up to 30 doses.   Linzess 72 MCG capsule Generic drug: linaclotide Take 1 capsule (72 mcg total) by mouth daily.   ondansetron 8 MG tablet Commonly known as: ZOFRAN Take 8 mg by mouth  every 8 (eight) hours as needed.   oxyCODONE-acetaminophen 5-325 MG tablet Commonly known as: Percocet Take 1 tablet by mouth every 4 (four) hours as needed for up to 18 doses for severe pain (pain score 7-10).   pantoprazole 40 MG tablet Commonly known as: Protonix Take 1 tablet (40 mg total) by mouth daily. What changed: when to take this   TYLENOL 500 MG tablet Generic drug: acetaminophen Take 500-1,000 mg by mouth every 8 (eight) hours as needed for mild pain (pain score 1-3) or headache.        Procedures/Studies: CT Renal Stone Study Result Date: 09/26/2023 CLINICAL DATA:  Abdominal and flank pain. EXAM: CT ABDOMEN AND PELVIS WITHOUT CONTRAST TECHNIQUE: Multidetector CT imaging of the abdomen and pelvis was performed following the standard protocol without IV contrast. RADIATION DOSE REDUCTION: This exam was performed according to the departmental dose-optimization program which includes automated exposure control,  adjustment of the mA and/or kV according to patient size and/or use of iterative reconstruction technique. COMPARISON:  CT abdomen and pelvis 09/09/2023 FINDINGS: Lower chest: There is atelectasis in the right lung base. Hepatobiliary: No focal liver abnormality is seen. No gallstones, gallbladder wall thickening, or biliary dilatation. Pancreas: Unremarkable. No pancreatic ductal dilatation or surrounding inflammatory changes. Spleen: Normal in size without focal abnormality. Adrenals/Urinary Tract: There is moderate right-sided hydroureteronephrosis. There is right ureteral wall thickening. There is fat stranding surrounding the right kidney and proximal right ureter. No obstructing calculus identified. There is a small amount of air in the bladder. The bladder is otherwise within normal limits. The left kidney and adrenal glands are within normal limits. Stomach/Bowel: Stomach is within normal limits. Appendix appears normal. No evidence of bowel wall thickening, distention,  or inflammatory changes. There is diffuse colonic diverticulosis. Vascular/Lymphatic: No significant vascular findings are present. No enlarged abdominal or pelvic lymph nodes. Reproductive: Multiple uterine fibroids are present measuring up to 4.4 cm, unchanged. Ovaries are within normal limits. Other: There is a moderate-sized fat containing umbilical hernia. There is no ascites. Musculoskeletal: No acute or significant osseous findings. IMPRESSION: 1. Moderate right-sided hydroureteronephrosis with right ureteral wall thickening and surrounding fat stranding. No obstructing calculus identified. Findings may be related to recently passed calculus or infection. 2. Small amount of air in the bladder, possibly related to recent instrumentation or infection. 3. Colonic diverticulosis. 4. Uterine fibroids. 5. Moderate-sized fat containing umbilical hernia. Electronically Signed   By: Darliss Cheney M.D.   On: 09/26/2023 00:06   DG Chest Port 1 View Result Date: 09/25/2023 CLINICAL DATA:  Question of sepsis to evaluate for abnormality. EXAM: PORTABLE CHEST 1 VIEW COMPARISON:  01/05/2019 FINDINGS: The heart size and mediastinal contours are within normal limits. Both lungs are clear. The visualized skeletal structures are unremarkable. IMPRESSION: No active disease. Electronically Signed   By: Burman Nieves M.D.   On: 09/25/2023 23:04   DG C-Arm 1-60 Min-No Report Result Date: 09/22/2023 Fluoroscopy was utilized by the requesting physician.  No radiographic interpretation.   DG C-Arm 1-60 Min-No Report Result Date: 09/10/2023 Fluoroscopy was utilized by the requesting physician.  No radiographic interpretation.   CT ABDOMEN PELVIS W CONTRAST Result Date: 09/09/2023 CLINICAL DATA:  Provided history: Biliary obstruction suspected (Ped 0-17y) ruq pain, chole? EXAM: CT ABDOMEN AND PELVIS WITH CONTRAST TECHNIQUE: Multidetector CT imaging of the abdomen and pelvis was performed using the standard protocol following  bolus administration of intravenous contrast. RADIATION DOSE REDUCTION: This exam was performed according to the departmental dose-optimization program which includes automated exposure control, adjustment of the mA and/or kV according to patient size and/or use of iterative reconstruction technique. CONTRAST:  OMNIPAQUE IOHEXOL 300 MG/ML  SOLN COMPARISON:  CT 09/12/2018 FINDINGS: Lower chest: Subsegmental atelectasis in the right lower lobe. No pleural effusion. Hepatobiliary: No focal liver abnormality is seen. No gallstones, gallbladder wall thickening, or biliary dilatation. Pancreas: No ductal dilatation or inflammation. Spleen: Normal in size without focal abnormality. Adrenals/Urinary Tract: No adrenal nodule. Obstructing 6 x 7 mm stone in the distal right ureter (at the level of the upper sacrum) with moderate proximal hydroureteronephrosis. There is mild ureteral enhancement with perinephric and ureteric fat stranding the left kidney is unremarkable. Unremarkable urinary bladder. Stomach/Bowel: Colonic diverticulosis without diverticulitis. No bowel obstruction or inflammation. The appendix is normal. Vascular/Lymphatic: No acute vascular findings. Normal caliber abdominal aorta. Portal vein is patent. No abdominopelvic adenopathy. Reproductive: Lobulated uterus with multiple fibroids. Other: No  free air or ascites. Moderate fat containing umbilical hernia, no bowel involvement. Musculoskeletal: There are no acute or suspicious osseous abnormalities. IMPRESSION: 1. Obstructing 6 x 7 mm stone in the distal right ureter with moderate proximal hydroureteronephrosis. 2. Colonic diverticulosis without diverticulitis. 3. Fibroid uterus. 4. Moderate fat containing umbilical hernia. Electronically Signed   By: Narda Rutherford M.D.   On: 09/09/2023 21:41     Subjective: Pt reports feeling well, tolerating diet well, no complaints.    Discharge Exam: Vitals:   09/27/23 0446 09/27/23 0700  BP: 127/79  (!) 146/87  Pulse: 81 92  Resp: 20 20  Temp: 98.1 F (36.7 C) 97.9 F (36.6 C)  SpO2: 100% 100%   Vitals:   09/26/23 1823 09/26/23 2106 09/27/23 0446 09/27/23 0700  BP: 127/81 116/74 127/79 (!) 146/87  Pulse: 100 96 81 92  Resp: 16 20 20 20   Temp:  98.5 F (36.9 C) 98.1 F (36.7 C) 97.9 F (36.6 C)  TempSrc:    Oral  SpO2: 96% 99% 100% 100%  Weight:      Height:       General: Pt is alert, awake, not in acute distress Cardiovascular: normal S1/S2 +, no rubs, no gallops Respiratory: CTA bilaterally, no wheezing, no rhonchi Abdominal: Soft, NT, ND, bowel sounds + Extremities: no edema, no cyanosis   The results of significant diagnostics from this hospitalization (including imaging, microbiology, ancillary and laboratory) are listed below for reference.     Microbiology: Recent Results (from the past 240 hours)  Blood Culture (routine x 2)     Status: None (Preliminary result)   Collection Time: 09/25/23 10:57 PM   Specimen: BLOOD  Result Value Ref Range Status   Specimen Description BLOOD BLOOD RIGHT HAND  Final   Special Requests   Final    BOTTLES DRAWN AEROBIC AND ANAEROBIC Blood Culture adequate volume   Culture   Final    NO GROWTH 2 DAYS Performed at [facility], [address] [place], [place] [address]    Report Status PENDING  Incomplete  Blood Culture (routine x 2)     Status: None (Preliminary result)   Collection Time: 09/25/23 10:57 PM   Specimen: BLOOD  Result Value Ref Range Status   Specimen Description BLOOD BLOOD LEFT ARM  Final   Special Requests   Final    BOTTLES DRAWN AEROBIC AND ANAEROBIC Blood Culture results may not be optimal due to an inadequate volume of blood received in culture bottles   Culture   Final    NO GROWTH 2 DAYS Performed at [facility], [address], [place], [place] [address]    Report Status PENDING  Incomplete  Resp panel by RT-PCR (RSV, Flu A&B, Covid) Anterior Nasal Swab     Status: None   Collection Time:  09/25/23 11:18 PM   Specimen: Anterior Nasal Swab  Result Value Ref Range Status   SARS Coronavirus 2 by RT PCR NEGATIVE NEGATIVE Final    Comment: (NOTE) SARS-CoV-2 target nucleic acids are NOT DETECTED.  The SARS-CoV-2 RNA is generally detectable in upper respiratory specimens during the acute phase of infection. The lowest concentration of SARS-CoV-2 viral copies this assay can detect is 138 copies/mL. A negative result does not preclude SARS-Cov-2 infection and should not be used as the sole basis for treatment or other patient management decisions. A negative result may occur with  improper specimen collection/handling, submission of specimen other than nasopharyngeal swab, presence of viral mutation(s) within the areas  targeted by this assay, and inadequate number of viral copies(<138 copies/mL). A negative result must be combined with clinical observations, patient history, and epidemiological information. The expected result is Negative.  Fact Sheet for Patients:  BloggerCourse.com  Fact Sheet for Healthcare Providers:  SeriousBroker.it  This test is no t yet approved or cleared by the [place] FDA and  has been authorized for detection and/or diagnosis of SARS-CoV-2 by FDA under an Emergency Use Authorization (EUA). This EUA will remain  in effect (meaning this test can be used) for the duration of the COVID-19 declaration under Section 564(b)(1) of the Act, 21 U.S.C.section 360bbb-3(b)(1), unless the authorization is terminated  or revoked sooner.       Influenza A by PCR NEGATIVE NEGATIVE Final   Influenza B by PCR NEGATIVE NEGATIVE Final    Comment: (NOTE) The Xpert Xpress SARS-CoV-2/FLU/RSV plus assay is intended as an aid in the diagnosis of influenza from Nasopharyngeal swab specimens and should not be used as a sole basis for treatment. Nasal washings and aspirates are unacceptable for Xpert Xpress  SARS-CoV-2/FLU/RSV testing.  Fact Sheet for Patients: BloggerCourse.com  Fact Sheet for Healthcare Providers: SeriousBroker.it  This test is not yet approved or cleared by the [place] FDA and has been authorized for detection and/or diagnosis of SARS-CoV-2 by FDA under an Emergency Use Authorization (EUA). This EUA will remain in effect (meaning this test can be used) for the duration of the COVID-19 declaration under Section 564(b)(1) of the Act, 21 U.S.C. section 360bbb-3(b)(1), unless the authorization is terminated or revoked.     Resp Syncytial Virus by PCR NEGATIVE NEGATIVE Final    Comment: (NOTE) Fact Sheet for Patients: BloggerCourse.com  Fact Sheet for Healthcare Providers: SeriousBroker.it  This test is not yet approved or cleared by the [place] FDA and has been authorized for detection and/or diagnosis of SARS-CoV-2 by FDA under an Emergency Use Authorization (EUA). This EUA will remain in effect (meaning this test can be used) for the duration of the COVID-19 declaration under Section 564(b)(1) of the Act, 21 U.S.C. section 360bbb-3(b)(1), unless the authorization is terminated or revoked.  Performed at [facility], [address], [place], [place] [address]   Urine Culture     Status: Abnormal   Collection Time: 09/26/23 12:20 AM   Specimen: Urine, Random  Result Value Ref Range Status   Specimen Description   Final    URINE, RANDOM Performed at [facility], [address], [place], [place] [address]    Special Requests   Final    NONE Reflexed from [id number] Performed at [facility], [address] [place], [place] [address]    Culture (A)  Final    <10,000 COLONIES/mL INSIGNIFICANT GROWTH Performed at [facility], [address], [place], [place] [address]    Report Status 09/27/2023 FINAL  Final     Labs: BNP (last 3 results) No  results for input(s): "BNP" in the last 8760 hours. Basic Metabolic Panel: Recent Labs  Lab 09/25/23 2257 09/26/23 0608 09/27/23 0500  NA 135 139 142  K 2.7* 3.2* 3.6  CL 98 104 106  CO2 23 28 28   GLUCOSE 149* 130* 121*  BUN 8 7 11   CREATININE 0.95 0.81 0.82  CALCIUM 8.7* 8.5* 9.0  MG 1.8  --  2.3  PHOS 2.2*  --   --    Liver Function Tests: Recent Labs  Lab 09/25/23 2257 09/26/23 0608  AST 14* 11*  ALT 15 13  ALKPHOS 78 59  BILITOT 0.7 0.6  PROT 7.3 6.2*  ALBUMIN 3.3* 2.6*   No results for input(s): "LIPASE", "AMYLASE" in the last 168 hours. No results for input(s): "AMMONIA" in the last 168 hours. CBC: Recent Labs  Lab 09/25/23 2257 09/27/23 0500  WBC 8.9 5.4  NEUTROABS 6.1  --   HGB 10.8* 10.4*  HCT 33.9* 33.1*  MCV 83.3 85.8  PLT 330 389   Cardiac Enzymes: No results for input(s): "CKTOTAL", "CKMB", "CKMBINDEX", "TROPONINI" in the last 168 hours. BNP: Invalid input(s): "POCBNP" CBG: No results for input(s): "GLUCAP" in the last 168 hours. D-Dimer No results for input(s): "DDIMER" in the last 72 hours. Hgb A1c No results for input(s): "HGBA1C" in the last 72 hours. Lipid Profile No results for input(s): "CHOL", "HDL", "LDLCALC", "TRIG", "CHOLHDL", "LDLDIRECT" in the last 72 hours. Thyroid function studies No results for input(s): "TSH", "T4TOTAL", "T3FREE", "THYROIDAB" in the last 72 hours.  Invalid input(s): "FREET3" Anemia work up No results for input(s): "VITAMINB12", "FOLATE", "FERRITIN", "TIBC", "IRON", "RETICCTPCT" in the last 72 hours. Urinalysis    Component Value Date/Time   COLORURINE YELLOW 09/26/2023 0020   APPEARANCEUR HAZY (A) 09/26/2023 0020   LABSPEC 1.004 (L) 09/26/2023 0020   PHURINE 6.0 09/26/2023 0020   GLUCOSEU NEGATIVE 09/26/2023 0020   HGBUR LARGE (A) 09/26/2023 0020   BILIRUBINUR NEGATIVE 09/26/2023 0020   KETONESUR NEGATIVE 09/26/2023 0020   PROTEINUR 100 (A) 09/26/2023 0020   UROBILINOGEN 0.2 05/20/2014 1615    NITRITE POSITIVE (A) 09/26/2023 0020   LEUKOCYTESUR LARGE (A) 09/26/2023 0020   Sepsis Labs Recent Labs  Lab 09/25/23 2257 09/27/23 0500  WBC 8.9 5.4   Microbiology Recent Results (from the past 240 hours)  Blood Culture (routine x 2)     Status: None (Preliminary result)   Collection Time: 09/25/23 10:57 PM   Specimen: BLOOD  Result Value Ref Range Status   Specimen Description BLOOD BLOOD RIGHT HAND  Final   Special Requests   Final    BOTTLES DRAWN AEROBIC AND ANAEROBIC Blood Culture adequate volume   Culture   Final    NO GROWTH 2 DAYS Performed at [facility], [address] [place], [place] [address]    Report Status PENDING  Incomplete  Blood Culture (routine x 2)     Status: None (Preliminary result)   Collection Time: 09/25/23 10:57 PM   Specimen: BLOOD  Result Value Ref Range Status   Specimen Description BLOOD BLOOD LEFT ARM  Final   Special Requests   Final    BOTTLES DRAWN AEROBIC AND ANAEROBIC Blood Culture results may not be optimal due to an inadequate volume of blood received in culture bottles   Culture   Final    NO GROWTH 2 DAYS Performed at [facility], [address] [place], [place] [address]    Report Status PENDING  Incomplete  Resp panel by RT-PCR (RSV, Flu A&B, Covid) Anterior Nasal Swab     Status: None   Collection Time: 09/25/23 11:18 PM   Specimen: Anterior Nasal Swab  Result Value Ref Range Status   SARS Coronavirus 2 by RT PCR NEGATIVE NEGATIVE Final    Comment: (NOTE) SARS-CoV-2 target nucleic acids are NOT DETECTED.  The SARS-CoV-2 RNA is generally detectable in upper respiratory specimens during the acute phase of infection. The lowest concentration of SARS-CoV-2 viral copies this assay can detect is 138 copies/mL. A negative result does not preclude SARS-Cov-2 infection and should not be used as the sole  basis for treatment or other patient management decisions. A negative result may occur with  improper specimen  collection/handling, submission of specimen other than nasopharyngeal swab, presence of viral mutation(s) within the areas targeted by this assay, and inadequate number of viral copies(<138 copies/mL). A negative result must be combined with clinical observations, patient history, and epidemiological information. The expected result is Negative.  Fact Sheet for Patients:  BloggerCourse.com  Fact Sheet for Healthcare Providers:  SeriousBroker.it  This test is no t yet approved or cleared by the [place] FDA and  has been authorized for detection and/or diagnosis of SARS-CoV-2 by FDA under an Emergency Use Authorization (EUA). This EUA will remain  in effect (meaning this test can be used) for the duration of the COVID-19 declaration under Section 564(b)(1) of the Act, 21 U.S.C.section 360bbb-3(b)(1), unless the authorization is terminated  or revoked sooner.       Influenza A by PCR NEGATIVE NEGATIVE Final   Influenza B by PCR NEGATIVE NEGATIVE Final    Comment: (NOTE) The Xpert Xpress SARS-CoV-2/FLU/RSV plus assay is intended as an aid in the diagnosis of influenza from Nasopharyngeal swab specimens and should not be used as a sole basis for treatment. Nasal washings and aspirates are unacceptable for Xpert Xpress SARS-CoV-2/FLU/RSV testing.  Fact Sheet for Patients: BloggerCourse.com  Fact Sheet for Healthcare Providers: SeriousBroker.it  This test is not yet approved or cleared by the [place] FDA and has been authorized for detection and/or diagnosis of SARS-CoV-2 by FDA under an Emergency Use Authorization (EUA). This EUA will remain in effect (meaning this test can be used) for the duration of the COVID-19 declaration under Section 564(b)(1) of the Act, 21 U.S.C. section 360bbb-3(b)(1), unless the authorization is terminated or revoked.     Resp Syncytial  Virus by PCR NEGATIVE NEGATIVE Final    Comment: (NOTE) Fact Sheet for Patients: BloggerCourse.com  Fact Sheet for Healthcare Providers: SeriousBroker.it  This test is not yet approved or cleared by the [place] FDA and has been authorized for detection and/or diagnosis of SARS-CoV-2 by FDA under an Emergency Use Authorization (EUA). This EUA will remain in effect (meaning this test can be used) for the duration of the COVID-19 declaration under Section 564(b)(1) of the Act, 21 U.S.C. section 360bbb-3(b)(1), unless the authorization is terminated or revoked.  Performed at [facility], [address] [place], [place] [address]   Urine Culture     Status: Abnormal   Collection Time: 09/26/23 12:20 AM   Specimen: Urine, Random  Result Value Ref Range Status   Specimen Description   Final    URINE, RANDOM Performed at [facility], [address], [place], [place] [address]    Special Requests   Final    NONE Reflexed from [id number] Performed at [facility], [address] [place], [place] [address]    Culture (A)  Final    <10,000 COLONIES/mL INSIGNIFICANT GROWTH Performed at [facility] [facility], [address] [place], [place] [address]    Report Status 09/27/2023 FINAL  Final   Time coordinating discharge:  36 mins   SIGNED:  Standley Dakins, MD  [facility] 09/27/2023, 9:38 AM How to contact the [facility] Attending or Consulting provider 7A - 7P or covering provider during after hours 7P -7A, for this patient?  Check the care team in [facility] and look for a) attending/consulting [facility] provider listed and b) the [facility] team listed Log into [email or web address] and use [facility] universal password to access. If  you do not have the password, please contact the hospital operator. Locate the [facility] provider you are looking for under [facility] and page to a number that you can be directly reached. If you still have difficulty  reaching the provider, please page the [facility] (Director on Call) for the Hospitalists listed on amion for assistance.

## 2023-09-27 NOTE — Plan of Care (Signed)
   Problem: Activity: Goal: Risk for activity intolerance will decrease Outcome: Progressing   Problem: Coping: Goal: Level of anxiety will decrease Outcome: Progressing

## 2023-09-29 ENCOUNTER — Other Ambulatory Visit (HOSPITAL_COMMUNITY): Payer: Self-pay

## 2023-09-30 LAB — CULTURE, BLOOD (ROUTINE X 2)
Culture: NO GROWTH
Culture: NO GROWTH
Special Requests: ADEQUATE

## 2023-10-02 ENCOUNTER — Other Ambulatory Visit (HOSPITAL_COMMUNITY): Payer: Self-pay

## 2023-10-04 ENCOUNTER — Other Ambulatory Visit (HOSPITAL_COMMUNITY): Payer: Self-pay

## 2023-10-04 MED ORDER — PANTOPRAZOLE SODIUM 40 MG PO TBEC
40.0000 mg | DELAYED_RELEASE_TABLET | Freq: Every day | ORAL | 0 refills | Status: DC
Start: 2023-10-04 — End: 2024-02-19
  Filled 2023-10-04 (×2): qty 90, 90d supply, fill #0

## 2023-10-11 DIAGNOSIS — D72829 Elevated white blood cell count, unspecified: Secondary | ICD-10-CM | POA: Diagnosis not present

## 2023-10-11 DIAGNOSIS — N200 Calculus of kidney: Secondary | ICD-10-CM | POA: Diagnosis not present

## 2023-10-11 DIAGNOSIS — N390 Urinary tract infection, site not specified: Secondary | ICD-10-CM | POA: Diagnosis not present

## 2023-10-18 DIAGNOSIS — N132 Hydronephrosis with renal and ureteral calculous obstruction: Secondary | ICD-10-CM | POA: Diagnosis not present

## 2023-10-18 DIAGNOSIS — N3000 Acute cystitis without hematuria: Secondary | ICD-10-CM | POA: Diagnosis not present

## 2023-10-18 DIAGNOSIS — N201 Calculus of ureter: Secondary | ICD-10-CM | POA: Diagnosis not present

## 2023-10-25 ENCOUNTER — Other Ambulatory Visit (HOSPITAL_COMMUNITY): Payer: Self-pay

## 2023-11-21 ENCOUNTER — Other Ambulatory Visit (HOSPITAL_COMMUNITY): Payer: Self-pay

## 2023-11-21 MED ORDER — ATORVASTATIN CALCIUM 20 MG PO TABS
20.0000 mg | ORAL_TABLET | Freq: Every evening | ORAL | 2 refills | Status: DC
  Filled 2023-11-21: qty 90, 90d supply, fill #0

## 2023-11-22 ENCOUNTER — Other Ambulatory Visit (HOSPITAL_COMMUNITY): Payer: Self-pay

## 2024-01-22 DIAGNOSIS — Z1331 Encounter for screening for depression: Secondary | ICD-10-CM | POA: Diagnosis not present

## 2024-02-19 ENCOUNTER — Other Ambulatory Visit: Payer: Self-pay

## 2024-02-19 ENCOUNTER — Other Ambulatory Visit (HOSPITAL_COMMUNITY): Payer: Self-pay

## 2024-02-19 MED ORDER — ATORVASTATIN CALCIUM 20 MG PO TABS
20.0000 mg | ORAL_TABLET | Freq: Every evening | ORAL | 2 refills | Status: DC
  Filled 2024-02-19 (×2): qty 30, 30d supply, fill #0
  Filled 2024-04-13: qty 30, 30d supply, fill #1
  Filled 2024-05-10: qty 30, 30d supply, fill #2

## 2024-02-19 MED ORDER — PANTOPRAZOLE SODIUM 40 MG PO TBEC
40.0000 mg | DELAYED_RELEASE_TABLET | Freq: Every day | ORAL | 0 refills | Status: DC
  Filled 2024-02-19 (×2): qty 90, 90d supply, fill #0

## 2024-04-14 ENCOUNTER — Other Ambulatory Visit (HOSPITAL_COMMUNITY): Payer: Self-pay

## 2024-05-10 ENCOUNTER — Other Ambulatory Visit: Payer: Self-pay

## 2024-05-13 ENCOUNTER — Other Ambulatory Visit: Payer: Self-pay

## 2024-05-13 ENCOUNTER — Other Ambulatory Visit (HOSPITAL_COMMUNITY): Payer: Self-pay

## 2024-05-13 MED ORDER — PANTOPRAZOLE SODIUM 40 MG PO TBEC
40.0000 mg | DELAYED_RELEASE_TABLET | Freq: Every day | ORAL | 0 refills | Status: DC
  Filled 2024-05-13: qty 90, 90d supply, fill #0

## 2024-06-13 NOTE — Unmapped External Note (Signed)
 Assessment Note   Demographics Verification - Call Monitoring - Confidentiality      Member Verification: Member Verification    Call Monitoring Disclaimer: Call Monitoring Disclaimer         Person Providing Info on the Call   Who is the person providing the information on the call? Member      Limitations/Preferences   What, if any, physical limitations, health literacy, language needs and learning preferences do you have that I should be aware of when we talk? Health Literacy- No Limitations, Language- No Limitations, Learning Preferences- No Preference, Physical- No Limitations      Specify other language limitations        Specify other physical limitations        LCC Contact Type   Select the Health Your Way contact type: 4th or more contact-Telephonic Lifestyle Coaching           Lifestyle Coaching Weight Management Follow Up                    General Health Perception   How would you describe your health in general? My health is good      SMART GOAL & SMART GOAL Follow Up     SMART GOAL Member will walk 2x per week for 30 minutes (already started) and will continue until next call (1/8)   Did the member set a SMART goal? Yes  Did the member meet their previous SMART goal?  Yes      Brief Summary of Member Status   What medical conditions has your doctor diagnosed you with?    Past Medical History:  Diagnosis Date   High cholesterol      Did the member score via Care Engine for any condition(s) that he/she did NOT confirm?    HYW:  What medical conditions are you most concerned about and why?    In between MD visits people often find a need to go to the ER or an Urgent Care facilitycan you tell me if you had any concerns or issues in which you went to an ER or Urgent care in the last 12 months?      Condition Specific Metrics   Height/Weight/BMI  / /    Blood Pressure    Outcome Metrics      Medications    Current Medications[1]    There are no discontinued medications.     Medication Review         Depression Screening PHQ2/PHQ9 Exclusions   PHQ2 EXCLUSION CRITERIA:  Do NOT administer the PHQ-2 if any of the following apply to this member.   PHQ9 EXCLUSION CRITERIA: To determine if this assessment is right for you, I need to ask you some questions before we start.  Besides depression, have you been diagnosed with any other mental health issues such as: No applicable exclusions              Depression PHQ2/PHQ9 Screening Results     PHQ2  The PHQ-2 questions are scored from 0 (not at all) to 3 (nearly every day) and then added together: Score 0-2 = Not likely to be at risk for depression, Score 3-6 = At risk for depression (further screening recommended).        PHQ9 PHQ-9 Mini Module-If diagnosis of depression- show confirmed dx of depression and show PHQ-9 score & score description        Social Drivers of Health   Transportation Needs: No  Transportation Needs (09/26/2023)   Received from [facility] - Transportation    Lack of Transportation (Medical): No    Lack of Transportation (Non-Medical): No  Food Insecurity: No Food Insecurity (09/26/2023)   Received from [facility]   Hunger Vital Sign    Within the past 12 months, you worried that your food would run out before you got the money to buy more.: Never true    Within the past 12 months, the food you bought just didn't last and you didn't have money to get more.: Never true  Tobacco Use: Low Risk (03/20/2024)   Patient History    Smoking Tobacco Use: Never    Smokeless Tobacco Use: Never  Utilities: Not At Risk (09/26/2023)   Received from [facility] Utilities    Threatened with loss of utilities: No      Lab Results   Results     There are no results available from this visit.         Immunizations   Immunizations Mini Module (show all Q/A pairs answered during  this encounter)       Intervention/Actions   Education Topics Discussed      Referrals & Referral Follow Up          MEP/Mobile App Registration & Education on Tools/Resources   Is the member registered on the Member Engagement Platform (MEP) or Mobile App?  Explain tools and resources available on Member Engagement Platform [facility]) and how to get the most benefit out of them.  Indicate resources reviewed with member: Yes-registered on [facility] 07/11/2023    Letters and brochures from Teaching Laboratory Technician with coach    Next Scheduled Appointment      Date Provider Department Visit Type   07/11/2024 5:00 PM Devan [facility] Care Management [facility] Coach Follow Up Assmt- 1st Attempt         Follow Up Needs Identified   Lifestyle Coaching Addendum: Account: [facility] Account Phone Number: [phone] Member Name: Tyffany Waldrop Time Zone: PC[]  MT[]  CT[]  ET[x]  Call Number: 1 []  2 []  3 []  4+[x]   Health Conditions:  has a past medical history of High cholesterol.  Member Concerns (Focus/Agenda of Call): [x] Weight Management  [] Nutrition  [] Exercise  [] Stress Management [] Sleep  [] Tobacco Cessation  [] General   Mbr. states: Member states Thanksgiving holiday threw her off track and now she is noticing more cravings (soda). States she's drinking 2 sodas per day-is drinking less than 20oz and is drinking mini cans. States she was drinking juice and water  only during thanksgiving but then picked back up. Discussed how sugar can affect body and cravings. Discussed alternatives to soda (sparkling water  options), continuing to gradually decrease, working to every other day and tuning into body for headaches. Member states she's going to look into sparkling water  options to have on hand. Member wants to focus on consistency with exercise during the rest of the holiday season to help with managing weight-discussed benefits for weight + decreasing added sugar intake. Member is walking 2x  per week with husband-discussed benefits for accountability. States she has noticed less shortness of breath, can keep up with her husband, and has increased energy. Encouraged member to celebrate wins and accomplishments. Reminded about healthwise materials, MEP/mobile app and encouraged messaging.  Ht./Wt./BMI:     There is no height or weight on file to calculate BMI.   Smart Goal (required by 2nd call): Member will walk 2x per week for 30 minutes (already  started) and will continue until next call (1/8)  Confidence Level: 9  Referred to Member Engagement Platform/Mobile App.   Plan for next call (Follow up on health gaps/barriers identified on this interaction, [facility] Focus to be discussed on next call): F/U MEP/MAH and/or Mobile app registration and messaging F/U w/ member on Smart Goal  Review: Weight/goals Discuss: walking routine, soda intake, weight, barriers,   Unable to send healthwise materials-healthwise not loading during documentation         [1] No current outpatient medications on file.

## 2024-06-19 ENCOUNTER — Other Ambulatory Visit (HOSPITAL_BASED_OUTPATIENT_CLINIC_OR_DEPARTMENT_OTHER): Payer: Self-pay

## 2024-06-19 MED ORDER — LOSARTAN POTASSIUM 25 MG PO TABS
25.0000 mg | ORAL_TABLET | Freq: Every day | ORAL | 0 refills | Status: AC
  Filled 2024-06-19: qty 90, 90d supply, fill #0

## 2024-07-07 ENCOUNTER — Other Ambulatory Visit (HOSPITAL_COMMUNITY): Payer: Self-pay

## 2024-07-07 MED ORDER — ATORVASTATIN CALCIUM 20 MG PO TABS
20.0000 mg | ORAL_TABLET | Freq: Every evening | ORAL | 2 refills | Status: AC
  Filled 2024-07-07: qty 30, 30d supply, fill #0

## 2024-07-08 ENCOUNTER — Other Ambulatory Visit (HOSPITAL_COMMUNITY): Payer: Self-pay

## 2024-07-08 ENCOUNTER — Encounter: Payer: Self-pay | Admitting: Internal Medicine

## 2024-07-08 ENCOUNTER — Ambulatory Visit: Admitting: Internal Medicine

## 2024-07-08 ENCOUNTER — Telehealth (INDEPENDENT_AMBULATORY_CARE_PROVIDER_SITE_OTHER): Payer: Self-pay | Admitting: Internal Medicine

## 2024-07-08 ENCOUNTER — Other Ambulatory Visit: Payer: Self-pay

## 2024-07-08 VITALS — BP 146/84 | HR 108 | Temp 98.4°F | Ht 62.0 in | Wt 206.1 lb

## 2024-07-08 DIAGNOSIS — R1033 Periumbilical pain: Secondary | ICD-10-CM

## 2024-07-08 DIAGNOSIS — K219 Gastro-esophageal reflux disease without esophagitis: Secondary | ICD-10-CM

## 2024-07-08 DIAGNOSIS — R110 Nausea: Secondary | ICD-10-CM

## 2024-07-08 DIAGNOSIS — K429 Umbilical hernia without obstruction or gangrene: Secondary | ICD-10-CM

## 2024-07-08 DIAGNOSIS — K5904 Chronic idiopathic constipation: Secondary | ICD-10-CM

## 2024-07-08 MED ORDER — ONDANSETRON HCL 4 MG PO TABS
4.0000 mg | ORAL_TABLET | Freq: Three times a day (TID) | ORAL | 1 refills | Status: AC | PRN
  Filled 2024-07-08: qty 30, 10d supply, fill #0

## 2024-07-08 MED ORDER — PANTOPRAZOLE SODIUM 40 MG PO TBEC
40.0000 mg | DELAYED_RELEASE_TABLET | Freq: Every day | ORAL | 3 refills | Status: AC
  Filled 2024-07-08: qty 90, 90d supply, fill #0

## 2024-07-08 MED ORDER — LINACLOTIDE 72 MCG PO CAPS: 72.0000 ug | ORAL_CAPSULE | Freq: Every day | ORAL | Status: DC

## 2024-07-08 NOTE — Progress Notes (Signed)
 "   Primary Care Physician:  Skillman, Katherine E, PA-C Primary Gastroenterologist:  Dr. Cindie  Chief Complaint  Patient presents with   Constipation    Pt arrives to re-establish care. Pt has constipation and is taking Linzess ; does fairly well. No abdominal pain, nausea/vomiting. Does have episodes of tenderness over naval area.     HPI:   Darlene Vazquez is a 39 y.o. female who presents to the clinic today to reestablish care with gastroenterology.  Chronic GERD: Takes pantoprazole  40 mg daily.  Symptoms well-controlled.  Requesting refills.  Chronic idiopathic constipation: Taking Linzess  72 mcg daily.  Seems to not help as much as it used to.  Having bowel movements on average 2 times per week.  Occasional straining.  Umbilical hernia, periumbilical abdominal pain: Reports having an umbilical hernia for over 5 years now.  CT abdomen pelvis 09/25/2023 with moderate size fat-containing umbilical hernia.  Personally reviewed this today.  Occasionally causes discomfort when she gets constipated.  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  Past Medical History:  Diagnosis Date   Family history of adverse reaction to anesthesia    Mom N&V   GERD (gastroesophageal reflux disease)    History of kidney stones    PONV (postoperative nausea and vomiting)    Hard to arouse    Past Surgical History:  Procedure Laterality Date   CYSTOSCOPY W/ RETROGRADES Right 09/22/2023   Procedure: CYSTOSCOPY, WITH RETROGRADE PYELOGRAM;  Surgeon: Selma Donnice SAUNDERS, MD;  Location: [facility];  Service: Urology;  Laterality: Right;   CYSTOSCOPY W/ URETERAL STENT PLACEMENT Right 09/10/2023   Procedure: CYSTOSCOPY, WITH RETROGRADE PYELOGRAM AND URETERAL STENT INSERTION;  Surgeon: Selma Donnice SAUNDERS, MD;  Location: [facility];  Service: Urology;  Laterality: Right;   CYSTOSCOPY/URETEROSCOPY/HOLMIUM LASER/STENT PLACEMENT Right 09/22/2023   Procedure: CYSTOSCOPY/URETEROSCOPY/HOLMIUM LASER/STENT  PLACEMENT;  Surgeon: Selma Donnice SAUNDERS, MD;  Location: [facility];  Service: Urology;  Laterality: Right;   DIAGNOSTIC LAPAROSCOPY WITH REMOVAL OF ECTOPIC PREGNANCY Right 11/09/2013   Procedure: DIAGNOSTIC LAPAROSCOPY WITH REMOVAL OF ECTOPIC PREGNANCY;  Surgeon: Vonn VEAR Inch, MD;  Location: [facility];  Service: Gynecology;  Laterality: Right;   ESOPHAGOGASTRODUODENOSCOPY N/A 08/12/2014   SLF: 1. No obvious source for epigastric pain identified. 2. Mild gastiritis.    LAPAROSCOPY N/A 11/09/2013   Procedure: LAPAROSCOPY OPERATIVE;  Surgeon: Vonn VEAR Inch, MD;  Location: [facility];  Service: Gynecology;  Laterality: N/A;   UNILATERAL SALPINGECTOMY Right 11/09/2013   Procedure: UNILATERAL SALPINGECTOMY;  Surgeon: Vonn VEAR Inch, MD;  Location: [facility];  Service: Gynecology;  Laterality: Right;    Current Outpatient Medications  Medication Sig Dispense Refill   atorvastatin  (LIPITOR) 20 MG tablet Take 1 tablet (20 mg total) by mouth every evening. 30 tablet 2   docusate sodium  (COLACE) 100 MG capsule Take 1 capsule (100 mg total) by mouth daily as needed for up to 30 doses. 30 capsule 0   linaclotide  (LINZESS ) 72 MCG capsule Take 1 capsule (72 mcg total) by mouth daily. 90 capsule 0   losartan  (COZAAR ) 25 MG tablet Take 1 tablet (25 mg total) by mouth daily. 90 tablet 0   ondansetron  (ZOFRAN ) 8 MG tablet Take 8 mg by mouth every 8 (eight) hours as needed.     oxyCODONE -acetaminophen  (PERCOCET) 5-325 MG tablet Take 1 tablet by mouth every 4 (four) hours as needed for up to 18 doses for severe pain (pain score 7-10). 18 tablet 0   pantoprazole  (PROTONIX ) 40 MG tablet  Take 1 tablet (40 mg total) by mouth daily. 90 tablet 0   TYLENOL  500 MG tablet Take 500-1,000 mg by mouth every 8 (eight) hours as needed for mild pain (pain score 1-3) or headache.     No current facility-administered medications for this visit.    Allergies as of 07/08/2024 - Review Complete 07/08/2024  Allergen Reaction Noted   Other Hives and Other  (See Comments) 05/25/2017   Sulfa antibiotics Hives and Itching 09/10/2023    Family History  Problem Relation Age of Onset   Wilson's disease Maternal Grandfather    Colon cancer Neg Hx    Colon polyps Neg Hx     Social History   Socioeconomic History   Marital status: Married    Spouse name: Not on file   Number of children: Not on file   Years of education: Not on file   Highest education level: Not on file  Occupational History   Not on file  Tobacco Use   Smoking status: Never   Smokeless tobacco: Never   Tobacco comments:    Never smoked  Vaping Use   Vaping status: Never Used  Substance and Sexual Activity   Alcohol use: No    Alcohol/week: 0.0 standard drinks of alcohol   Drug use: No   Sexual activity: Yes    Birth control/protection: Surgical  Other Topics Concern   Not on file  Social History Narrative   Not on file   Social Drivers of Health   Tobacco Use: Low Risk (07/08/2024)   Patient History    Smoking Tobacco Use: Never    Smokeless Tobacco Use: Never    Passive Exposure: Not on file  Financial Resource Strain: Not on file  Food Insecurity: No Food Insecurity (09/26/2023)   Hunger Vital Sign    Worried About Running Out of Food in the Last Year: Never true    Ran Out of Food in the Last Year: Never true  Transportation Needs: No Transportation Needs (09/26/2023)   PRAPARE - Administrator, Civil Service (Medical): No    Lack of Transportation (Non-Medical): No  Physical Activity: Not on file  Stress: Not on file  Social Connections: Not on file  Intimate Partner Violence: Not At Risk (09/26/2023)   Humiliation, Afraid, Rape, and Kick questionnaire    Fear of Current or Ex-Partner: No    Emotionally Abused: No    Physically Abused: No    Sexually Abused: No  Depression (PHQ2-9): Not on file  Alcohol Screen: Not on file  Housing: Low Risk (09/26/2023)   Housing Stability Vital Sign    Unable to Pay for Housing in the Last Year:  No    Number of Times Moved in the Last Year: 0    Homeless in the Last Year: No  Utilities: Not At Risk (09/26/2023)   [facility] Utilities    Threatened with loss of utilities: No  Health Literacy: Not on file    Subjective: Review of Systems  Constitutional:  Negative for chills and fever.  HENT:  Negative for congestion and hearing loss.   Eyes:  Negative for blurred vision and double vision.  Respiratory:  Negative for cough and shortness of breath.   Cardiovascular:  Negative for chest pain and palpitations.  Gastrointestinal:  Positive for abdominal pain and constipation. Negative for blood in stool, diarrhea, heartburn, melena and vomiting.  Genitourinary:  Negative for dysuria and urgency.  Musculoskeletal:  Negative for joint pain and myalgias.  Skin:  Negative for itching and rash.  Neurological:  Negative for dizziness and headaches.  Psychiatric/Behavioral:  Negative for depression. The patient is not nervous/anxious.        Objective: BP (!) 146/84   Pulse (!) 108   Temp 98.4 F (36.9 C)   Ht 5' 2 (1.575 m)   Wt 206 lb 1.6 oz (93.5 kg)   LMP 06/17/2024 (Approximate)   Breastfeeding No   BMI 37.70 kg/m  Physical Exam Constitutional:      Appearance: Normal appearance.  HENT:     Head: Normocephalic and atraumatic.  Eyes:     Extraocular Movements: Extraocular movements intact.     Conjunctiva/sclera: Conjunctivae normal.  Cardiovascular:     Rate and Rhythm: Normal rate and regular rhythm.  Pulmonary:     Effort: Pulmonary effort is normal.     Breath sounds: Normal breath sounds.  Abdominal:     General: Bowel sounds are normal.     Palpations: Abdomen is soft.  Musculoskeletal:        General: No swelling. Normal range of motion.     Cervical back: Normal range of motion and neck supple.  Skin:    General: Skin is warm and dry.     Coloration: Skin is not jaundiced.  Neurological:     General: No focal deficit present.     Mental Status: She is  alert and oriented to person, place, and time.  Psychiatric:        Mood and Affect: Mood normal.        Behavior: Behavior normal.      Assessment/Plan:  1.  Chronic idiopathic constipation-not ideally controlled on Linzess  72 mcg daily.  Samples of 145 mcg provided today.  Call with update in 1 to 2 weeks and I can send in formal prescription if improved.  2.  Umbilical hernia, periumbilical abdominal pain-discussed in depth with patient today.  Also reviewed CT images with her.  She would like to meet with a general surgeon to discuss repair.  Will refer to [facility] today.  3.  Chronic GERD-well-controlled on pantoprazole  daily.  Refill today.  4.  Intermittent nausea-refilled ondansetron  to take as needed.   Follow-up in 3 months or sooner if needed.  07/08/2024 3:44 PM    "

## 2024-07-08 NOTE — Telephone Encounter (Signed)
 Medication Samples have been provided to the patient.  Drug name: Linzess        Strength: 72 mcg        Qty: 3 boxes   LOT: [id number]  Exp.Date: 08/03/2026  Dosing instructions: take one tablet po daily (Pt has prescription for 72 mcg already but provider wanted her to have ; office out of samples)  The patient has been instructed regarding the correct time, dose, and frequency of taking this medication, including desired effects and most common side effects.   Glenys Bruns 4:08 PM 07/08/2024

## 2024-07-08 NOTE — Patient Instructions (Signed)
 I am going to give you samples of Linzess  145 mcg daily.  Try this for a few weeks and let me know if improved and I can change your prescription.  I will refer you to general surgeons to discuss umbilical hernia repair.  Continue on pantoprazole  daily.  I sent in a year supply refills today.  Continue ondansetron  as needed for nausea, I refilled this today as well.  Follow-up in 3 months or sooner if needed.  It was very nice seeing you today.  Dr. Cindie

## 2024-07-09 ENCOUNTER — Other Ambulatory Visit (HOSPITAL_COMMUNITY): Payer: Self-pay

## 2024-07-09 ENCOUNTER — Other Ambulatory Visit: Payer: Self-pay

## 2024-07-17 ENCOUNTER — Other Ambulatory Visit: Payer: Self-pay | Admitting: Internal Medicine

## 2024-07-17 ENCOUNTER — Telehealth (INDEPENDENT_AMBULATORY_CARE_PROVIDER_SITE_OTHER): Payer: Self-pay | Admitting: Internal Medicine

## 2024-07-17 ENCOUNTER — Other Ambulatory Visit: Payer: Self-pay

## 2024-07-17 MED ORDER — LINACLOTIDE 145 MCG PO CAPS
145.0000 ug | ORAL_CAPSULE | Freq: Every day | ORAL | 3 refills | Status: AC
  Filled 2024-07-17: qty 90, 90d supply, fill #0

## 2024-07-17 NOTE — Telephone Encounter (Signed)
 Patient called in and stated the double dose of Linzess  is working for her. she was given 72 mcg due to being out of 145 mcg samples. she would script sent to [facility]. thank you!  6 mins CC Carlin MARLA Hasty, DO Script sent to [facility]. Thank you

## 2024-08-01 ENCOUNTER — Ambulatory Visit: Admitting: General Surgery

## 2024-08-01 ENCOUNTER — Encounter: Payer: Self-pay | Admitting: General Surgery

## 2024-08-01 VITALS — BP 136/88 | HR 113 | Temp 97.7°F | Resp 16 | Ht 62.0 in | Wt 205.0 lb

## 2024-08-01 DIAGNOSIS — K439 Ventral hernia without obstruction or gangrene: Secondary | ICD-10-CM

## 2024-08-02 NOTE — Progress Notes (Signed)
 Darlene Vazquez; [id number]; [birth year]   HPI Patient is a 39 year old black female who was referred to my care by Dr. Cindie for evaluation and treatment of an umbilical hernia.  Patient has had intermittent swelling and pain at the umbilicus for some time now.  She is status post a laparoscopy for an ectopic pregnancy in the remote past.  Recently she has started having increasing swelling and pain at the umbilicus which is made worse with straining. Past Medical History:  Diagnosis Date   Family history of adverse reaction to anesthesia    Mom N&V   GERD (gastroesophageal reflux disease)    History of kidney stones    PONV (postoperative nausea and vomiting)    Hard to arouse    Past Surgical History:  Procedure Laterality Date   CYSTOSCOPY W/ RETROGRADES Right 09/22/2023   Procedure: CYSTOSCOPY, WITH RETROGRADE PYELOGRAM;  Surgeon: Selma Donnice SAUNDERS, MD;  [facility];  Service: Urology;  Laterality: Right;   CYSTOSCOPY W/ URETERAL STENT PLACEMENT Right 09/10/2023   Procedure: CYSTOSCOPY, WITH RETROGRADE PYELOGRAM AND URETERAL STENT INSERTION;  Surgeon: Selma Donnice SAUNDERS, MD;  Location: [facility];  Service: Urology;  Laterality: Right;   CYSTOSCOPY/URETEROSCOPY/HOLMIUM LASER/STENT PLACEMENT Right 09/22/2023   Procedure: CYSTOSCOPY/URETEROSCOPY/HOLMIUM LASER/STENT PLACEMENT;  Surgeon: Selma Donnice SAUNDERS, MD;  [facility];  Service: Urology;  Laterality: Right;   DIAGNOSTIC LAPAROSCOPY WITH REMOVAL OF ECTOPIC PREGNANCY Right 11/09/2013   Procedure: DIAGNOSTIC LAPAROSCOPY WITH REMOVAL OF ECTOPIC PREGNANCY;  Surgeon: Vonn VEAR Inch, MD;  Location: [facility];  Service: Gynecology;  Laterality: Right;   ESOPHAGOGASTRODUODENOSCOPY N/A 08/12/2014   SLF: 1. No obvious source for epigastric pain identified. 2. Mild gastiritis.    LAPAROSCOPY N/A 11/09/2013   Procedure: LAPAROSCOPY OPERATIVE;  Surgeon: Vonn VEAR Inch, MD;  Location: [facility];  Service: Gynecology;  Laterality: N/A;   UNILATERAL SALPINGECTOMY Right  11/09/2013   Procedure: UNILATERAL SALPINGECTOMY;  Surgeon: Vonn VEAR Inch, MD;  Location: [facility];  Service: Gynecology;  Laterality: Right;    Family History  Problem Relation Age of Onset   Wilson's disease Maternal Grandfather    Colon cancer Neg Hx    Colon polyps Neg Hx     Medications Ordered Prior to Encounter[1]  Allergies[2]  Social History   Substance and Sexual Activity  Alcohol Use No   Alcohol/week: 0.0 standard drinks of alcohol    Tobacco Use History[3]  Review of Systems  Constitutional: Negative.   HENT: Negative.    Eyes: Negative.   Respiratory: Negative.    Cardiovascular: Negative.   Gastrointestinal: Negative.   Genitourinary: Negative.   Musculoskeletal: Negative.   Skin: Negative.   Neurological: Negative.   Endo/Heme/Allergies: Negative.   Psychiatric/Behavioral: Negative.      Objective   Vitals:   08/01/24 1132  BP: 136/88  Pulse: (!) 113  Resp: 16  Temp: 97.7 F (36.5 C)  SpO2: 96%    Physical Exam Vitals reviewed.  Constitutional:      Appearance: Normal appearance. She is not ill-appearing.  HENT:     Head: Normocephalic and atraumatic.  Cardiovascular:     Rate and Rhythm: Normal rate and regular rhythm.     Heart sounds: Normal heart sounds. No murmur heard.    No friction rub. No gallop.  Pulmonary:     Effort: Pulmonary effort is normal. No respiratory distress.     Breath sounds: Normal breath sounds. No stridor. No wheezing, rhonchi or rales.  Abdominal:     General: Bowel  sounds are normal. There is no distension.     Palpations: Abdomen is soft. There is no mass.     Tenderness: There is abdominal tenderness. There is no guarding or rebound.     Hernia: A hernia is present.     Comments: A 2 cm reducible supraumbilical hernia is present.  She is tender when reducing it.  Skin:    General: Skin is warm and dry.  Neurological:     Mental Status: She is alert and oriented to person, place, and time.      Assessment  Umbilical hernia Plan  Patient will call to schedule a robotic assisted laparoscopic umbilical herniorrhaphy with mesh.  The risks and benefits of the procedure including bleeding, infection, mesh use, and the possibility of recurrence of the hernia were fully explained to the patient, who gave informed consent.    [1]  Current Outpatient Medications on File Prior to Visit  Medication Sig Dispense Refill   atorvastatin  (LIPITOR) 20 MG tablet Take 1 tablet (20 mg total) by mouth every evening. 30 tablet 2   linaclotide  (LINZESS ) 145 MCG CAPS capsule Take 1 capsule (145 mcg total) by mouth daily before breakfast. 90 capsule 3   losartan  (COZAAR ) 25 MG tablet Take 1 tablet (25 mg total) by mouth daily. 90 tablet 0   ondansetron  (ZOFRAN ) 4 MG tablet Take 1 tablet (4 mg total) by mouth every 8 (eight) hours as needed for nausea or vomiting. 30 tablet 1   pantoprazole  (PROTONIX ) 40 MG tablet Take 1 tablet (40 mg total) by mouth daily. 90 tablet 3   TYLENOL  500 MG tablet Take 500-1,000 mg by mouth every 8 (eight) hours as needed for mild pain (pain score 1-3) or headache.     No current facility-administered medications on file prior to visit.  [2]  Allergies Allergen Reactions   Other Hives and Other (See Comments)    Hair dye   Sulfa Antibiotics Hives and Itching  [3]  Social History Tobacco Use  Smoking Status Never  Smokeless Tobacco Never  Tobacco Comments   Never smoked

## 2024-08-07 ENCOUNTER — Other Ambulatory Visit: Payer: Self-pay

## 2024-08-07 ENCOUNTER — Other Ambulatory Visit (HOSPITAL_COMMUNITY): Payer: Self-pay

## 2024-08-07 MED ORDER — LOSARTAN POTASSIUM 50 MG PO TABS
50.0000 mg | ORAL_TABLET | Freq: Every day | ORAL | 0 refills | Status: AC
  Filled 2024-08-07: qty 90, 90d supply, fill #0

## 2024-09-13 ENCOUNTER — Other Ambulatory Visit (HOSPITAL_COMMUNITY)

## 2024-09-18 ENCOUNTER — Ambulatory Visit (HOSPITAL_COMMUNITY): Admit: 2024-09-18 | Admitting: General Surgery
